# Patient Record
Sex: Female | Born: 1955 | Race: White | Hispanic: No | Marital: Married | Smoking: Former smoker
Health system: Southern US, Community
[De-identification: ages and names within clinical notes are randomized; demographics above are authoritative.]

## PROBLEM LIST (undated history)

## (undated) DIAGNOSIS — E7800 Pure hypercholesterolemia, unspecified: Secondary | ICD-10-CM

## (undated) DIAGNOSIS — K219 Gastro-esophageal reflux disease without esophagitis: Secondary | ICD-10-CM

## (undated) DIAGNOSIS — N23 Unspecified renal colic: Secondary | ICD-10-CM

## (undated) DIAGNOSIS — F411 Generalized anxiety disorder: Secondary | ICD-10-CM

## (undated) DIAGNOSIS — M1990 Unspecified osteoarthritis, unspecified site: Secondary | ICD-10-CM

## (undated) DIAGNOSIS — Z9079 Acquired absence of other genital organ(s): Secondary | ICD-10-CM

## (undated) DIAGNOSIS — Z9889 Other specified postprocedural states: Secondary | ICD-10-CM

## (undated) DIAGNOSIS — E039 Hypothyroidism, unspecified: Secondary | ICD-10-CM

## (undated) DIAGNOSIS — K5792 Diverticulitis of intestine, part unspecified, without perforation or abscess without bleeding: Secondary | ICD-10-CM

## (undated) DIAGNOSIS — A048 Other specified bacterial intestinal infections: Secondary | ICD-10-CM

## (undated) DIAGNOSIS — G4700 Insomnia, unspecified: Secondary | ICD-10-CM

## (undated) DIAGNOSIS — K449 Diaphragmatic hernia without obstruction or gangrene: Secondary | ICD-10-CM

## (undated) DIAGNOSIS — R5383 Other fatigue: Secondary | ICD-10-CM

## (undated) DIAGNOSIS — R160 Hepatomegaly, not elsewhere classified: Secondary | ICD-10-CM

## (undated) DIAGNOSIS — IMO0001 Reserved for inherently not codable concepts without codable children: Secondary | ICD-10-CM

## (undated) DIAGNOSIS — Z9189 Other specified personal risk factors, not elsewhere classified: Secondary | ICD-10-CM

## (undated) DIAGNOSIS — F3289 Other specified depressive episodes: Secondary | ICD-10-CM

## (undated) DIAGNOSIS — R109 Unspecified abdominal pain: Secondary | ICD-10-CM

## (undated) DIAGNOSIS — R5381 Other malaise: Secondary | ICD-10-CM

## (undated) DIAGNOSIS — K589 Irritable bowel syndrome without diarrhea: Secondary | ICD-10-CM

## (undated) DIAGNOSIS — R112 Nausea with vomiting, unspecified: Secondary | ICD-10-CM

## (undated) DIAGNOSIS — F17200 Nicotine dependence, unspecified, uncomplicated: Secondary | ICD-10-CM

## (undated) DIAGNOSIS — F329 Major depressive disorder, single episode, unspecified: Secondary | ICD-10-CM

## (undated) DIAGNOSIS — M797 Fibromyalgia: Secondary | ICD-10-CM

## (undated) DIAGNOSIS — N390 Urinary tract infection, site not specified: Secondary | ICD-10-CM

## (undated) HISTORY — DX: Nicotine dependence, unspecified, uncomplicated: F17.200

## (undated) HISTORY — DX: Diverticulitis of intestine, part unspecified, without perforation or abscess without bleeding: K57.92

## (undated) HISTORY — DX: Unspecified renal colic: N23

## (undated) HISTORY — DX: Insomnia, unspecified: G47.00

## (undated) HISTORY — DX: Other specified depressive episodes: F32.89

## (undated) HISTORY — PX: EYE SURGERY: SHX253

## (undated) HISTORY — DX: Major depressive disorder, single episode, unspecified: F32.9

## (undated) HISTORY — DX: Unspecified osteoarthritis, unspecified site: M19.90

## (undated) HISTORY — DX: Other specified bacterial intestinal infections: A04.8

## (undated) HISTORY — PX: LAPAROSCOPY: SHX197

## (undated) HISTORY — DX: Gastro-esophageal reflux disease without esophagitis: K21.9

## (undated) HISTORY — DX: Other fatigue: R53.83

## (undated) HISTORY — DX: Pure hypercholesterolemia, unspecified: E78.00

## (undated) HISTORY — DX: Diaphragmatic hernia without obstruction or gangrene: K44.9

## (undated) HISTORY — DX: Unspecified abdominal pain: R10.9

## (undated) HISTORY — PX: FOOT SURGERY: SHX648

## (undated) HISTORY — DX: Hepatomegaly, not elsewhere classified: R16.0

## (undated) HISTORY — PX: OTHER SURGICAL HISTORY: SHX169

## (undated) HISTORY — DX: Generalized anxiety disorder: F41.1

## (undated) HISTORY — DX: Acquired absence of other genital organ(s): Z90.79

## (undated) HISTORY — PX: ABDOMINAL HYSTERECTOMY: SHX81

## (undated) HISTORY — DX: Reserved for inherently not codable concepts without codable children: IMO0001

## (undated) HISTORY — DX: Other malaise: R53.81

## (undated) HISTORY — DX: Irritable bowel syndrome, unspecified: K58.9

## (undated) HISTORY — DX: Other specified personal risk factors, not elsewhere classified: Z91.89

## (undated) HISTORY — DX: Urinary tract infection, site not specified: N39.0

---

## 1998-08-06 HISTORY — PX: ABDOMINAL HYSTERECTOMY: SUR658

## 1998-08-06 HISTORY — PX: OOPHORECTOMY: SHX86

## 1998-11-07 ENCOUNTER — Emergency Department (HOSPITAL_COMMUNITY): Admission: EM | Admit: 1998-11-07 | Discharge: 1998-11-07 | Payer: Self-pay | Admitting: Emergency Medicine

## 1998-11-07 ENCOUNTER — Encounter: Payer: Self-pay | Admitting: Emergency Medicine

## 1999-02-23 ENCOUNTER — Other Ambulatory Visit: Admission: RE | Admit: 1999-02-23 | Discharge: 1999-02-23 | Payer: Self-pay | Admitting: Gynecology

## 1999-02-23 ENCOUNTER — Encounter (INDEPENDENT_AMBULATORY_CARE_PROVIDER_SITE_OTHER): Payer: Self-pay

## 1999-06-14 ENCOUNTER — Ambulatory Visit (HOSPITAL_COMMUNITY): Admission: RE | Admit: 1999-06-14 | Discharge: 1999-06-14 | Payer: Self-pay | Admitting: Orthopedic Surgery

## 1999-06-14 ENCOUNTER — Encounter: Payer: Self-pay | Admitting: Orthopedic Surgery

## 1999-10-11 ENCOUNTER — Inpatient Hospital Stay (HOSPITAL_COMMUNITY): Admission: RE | Admit: 1999-10-11 | Discharge: 1999-10-12 | Payer: Self-pay | Admitting: Gynecology

## 1999-10-11 ENCOUNTER — Encounter (INDEPENDENT_AMBULATORY_CARE_PROVIDER_SITE_OTHER): Payer: Self-pay | Admitting: Specialist

## 1999-12-26 ENCOUNTER — Ambulatory Visit (HOSPITAL_COMMUNITY): Admission: RE | Admit: 1999-12-26 | Discharge: 1999-12-26 | Payer: Self-pay | Admitting: Orthopedic Surgery

## 1999-12-26 ENCOUNTER — Encounter: Payer: Self-pay | Admitting: Orthopedic Surgery

## 2000-02-22 ENCOUNTER — Encounter: Admission: RE | Admit: 2000-02-22 | Discharge: 2000-03-26 | Payer: Self-pay | Admitting: Family Medicine

## 2000-10-24 ENCOUNTER — Ambulatory Visit (HOSPITAL_COMMUNITY): Admission: RE | Admit: 2000-10-24 | Discharge: 2000-10-24 | Payer: Self-pay | Admitting: Chiropractor

## 2000-10-24 ENCOUNTER — Encounter: Payer: Self-pay | Admitting: Chiropractor

## 2001-01-23 ENCOUNTER — Other Ambulatory Visit: Admission: RE | Admit: 2001-01-23 | Discharge: 2001-01-23 | Payer: Self-pay | Admitting: Family Medicine

## 2001-09-26 ENCOUNTER — Emergency Department (HOSPITAL_COMMUNITY): Admission: EM | Admit: 2001-09-26 | Discharge: 2001-09-26 | Payer: Self-pay | Admitting: *Deleted

## 2001-10-21 ENCOUNTER — Encounter: Admission: RE | Admit: 2001-10-21 | Discharge: 2001-10-21 | Payer: Self-pay | Admitting: Urology

## 2001-10-21 ENCOUNTER — Encounter: Payer: Self-pay | Admitting: Urology

## 2002-08-11 ENCOUNTER — Other Ambulatory Visit: Admission: RE | Admit: 2002-08-11 | Discharge: 2002-08-11 | Payer: Self-pay | Admitting: Family Medicine

## 2002-12-11 ENCOUNTER — Encounter: Payer: Self-pay | Admitting: Internal Medicine

## 2002-12-11 ENCOUNTER — Encounter: Admission: RE | Admit: 2002-12-11 | Discharge: 2002-12-11 | Payer: Self-pay | Admitting: Internal Medicine

## 2004-04-03 ENCOUNTER — Other Ambulatory Visit: Admission: RE | Admit: 2004-04-03 | Discharge: 2004-04-03 | Payer: Self-pay | Admitting: Family Medicine

## 2004-11-15 ENCOUNTER — Ambulatory Visit: Payer: Self-pay | Admitting: Family Medicine

## 2004-12-08 ENCOUNTER — Ambulatory Visit: Payer: Self-pay | Admitting: Family Medicine

## 2005-01-18 ENCOUNTER — Ambulatory Visit: Payer: Self-pay | Admitting: Family Medicine

## 2005-01-23 ENCOUNTER — Ambulatory Visit: Payer: Self-pay | Admitting: Family Medicine

## 2005-08-09 ENCOUNTER — Ambulatory Visit: Payer: Self-pay | Admitting: Family Medicine

## 2005-08-22 ENCOUNTER — Encounter: Admission: RE | Admit: 2005-08-22 | Discharge: 2005-08-22 | Payer: Self-pay | Admitting: Family Medicine

## 2005-11-12 ENCOUNTER — Ambulatory Visit: Payer: Self-pay | Admitting: Family Medicine

## 2005-11-12 ENCOUNTER — Encounter: Admission: RE | Admit: 2005-11-12 | Discharge: 2005-11-12 | Payer: Self-pay | Admitting: Family Medicine

## 2005-12-04 ENCOUNTER — Ambulatory Visit: Payer: Self-pay | Admitting: Family Medicine

## 2006-09-19 ENCOUNTER — Encounter: Admission: RE | Admit: 2006-09-19 | Discharge: 2006-09-19 | Payer: Self-pay | Admitting: Family Medicine

## 2006-12-18 DIAGNOSIS — IMO0001 Reserved for inherently not codable concepts without codable children: Secondary | ICD-10-CM

## 2006-12-18 DIAGNOSIS — F329 Major depressive disorder, single episode, unspecified: Secondary | ICD-10-CM

## 2006-12-18 DIAGNOSIS — F17200 Nicotine dependence, unspecified, uncomplicated: Secondary | ICD-10-CM

## 2006-12-18 DIAGNOSIS — M1990 Unspecified osteoarthritis, unspecified site: Secondary | ICD-10-CM | POA: Insufficient documentation

## 2006-12-18 DIAGNOSIS — Z9079 Acquired absence of other genital organ(s): Secondary | ICD-10-CM | POA: Insufficient documentation

## 2007-03-03 ENCOUNTER — Ambulatory Visit: Payer: Self-pay | Admitting: Family Medicine

## 2007-03-03 DIAGNOSIS — R319 Hematuria, unspecified: Secondary | ICD-10-CM | POA: Insufficient documentation

## 2007-03-03 LAB — CONVERTED CEMR LAB
Bilirubin Urine: NEGATIVE
Glucose, Urine, Semiquant: NEGATIVE
Protein, U semiquant: NEGATIVE
pH: 6.5

## 2007-03-04 ENCOUNTER — Encounter: Payer: Self-pay | Admitting: Family Medicine

## 2007-03-07 ENCOUNTER — Ambulatory Visit: Payer: Self-pay | Admitting: Family Medicine

## 2007-03-07 DIAGNOSIS — F411 Generalized anxiety disorder: Secondary | ICD-10-CM | POA: Insufficient documentation

## 2007-03-07 DIAGNOSIS — Z9189 Other specified personal risk factors, not elsewhere classified: Secondary | ICD-10-CM

## 2007-03-08 ENCOUNTER — Encounter: Payer: Self-pay | Admitting: Family Medicine

## 2007-03-17 ENCOUNTER — Telehealth: Payer: Self-pay | Admitting: Family Medicine

## 2007-03-21 ENCOUNTER — Ambulatory Visit: Payer: Self-pay | Admitting: Family Medicine

## 2007-03-21 DIAGNOSIS — N390 Urinary tract infection, site not specified: Secondary | ICD-10-CM | POA: Insufficient documentation

## 2007-03-21 LAB — CONVERTED CEMR LAB
Glucose, Urine, Semiquant: NEGATIVE
Specific Gravity, Urine: 1.02
WBC Urine, dipstick: NEGATIVE
pH: 6

## 2007-03-24 ENCOUNTER — Encounter (INDEPENDENT_AMBULATORY_CARE_PROVIDER_SITE_OTHER): Payer: Self-pay | Admitting: *Deleted

## 2007-03-24 LAB — CONVERTED CEMR LAB: IgG (Immunoglobin G), Serum: 925 mg/dL (ref 694–1618)

## 2007-03-25 ENCOUNTER — Telehealth: Payer: Self-pay | Admitting: Family Medicine

## 2007-03-25 ENCOUNTER — Ambulatory Visit: Payer: Self-pay | Admitting: Cardiology

## 2007-03-25 ENCOUNTER — Ambulatory Visit: Payer: Self-pay | Admitting: Family Medicine

## 2007-03-25 DIAGNOSIS — N23 Unspecified renal colic: Secondary | ICD-10-CM | POA: Insufficient documentation

## 2007-03-26 ENCOUNTER — Telehealth (INDEPENDENT_AMBULATORY_CARE_PROVIDER_SITE_OTHER): Payer: Self-pay | Admitting: *Deleted

## 2007-04-01 ENCOUNTER — Ambulatory Visit (HOSPITAL_COMMUNITY): Admission: RE | Admit: 2007-04-01 | Discharge: 2007-04-01 | Payer: Self-pay | Admitting: Urology

## 2007-09-22 ENCOUNTER — Encounter: Admission: RE | Admit: 2007-09-22 | Discharge: 2007-09-22 | Payer: Self-pay | Admitting: Family Medicine

## 2007-12-09 ENCOUNTER — Telehealth (INDEPENDENT_AMBULATORY_CARE_PROVIDER_SITE_OTHER): Payer: Self-pay | Admitting: *Deleted

## 2009-08-17 ENCOUNTER — Encounter: Admission: RE | Admit: 2009-08-17 | Discharge: 2009-08-17 | Payer: Self-pay | Admitting: Family Medicine

## 2010-04-18 ENCOUNTER — Encounter: Payer: Self-pay | Admitting: Gastroenterology

## 2010-04-24 ENCOUNTER — Encounter (INDEPENDENT_AMBULATORY_CARE_PROVIDER_SITE_OTHER): Payer: Self-pay | Admitting: *Deleted

## 2010-06-07 DIAGNOSIS — G4700 Insomnia, unspecified: Secondary | ICD-10-CM

## 2010-06-07 DIAGNOSIS — E7800 Pure hypercholesterolemia, unspecified: Secondary | ICD-10-CM

## 2010-06-07 DIAGNOSIS — R5383 Other fatigue: Secondary | ICD-10-CM

## 2010-06-07 DIAGNOSIS — R109 Unspecified abdominal pain: Secondary | ICD-10-CM | POA: Insufficient documentation

## 2010-06-07 DIAGNOSIS — R5381 Other malaise: Secondary | ICD-10-CM

## 2010-06-09 ENCOUNTER — Ambulatory Visit: Payer: Self-pay | Admitting: Gastroenterology

## 2010-06-09 ENCOUNTER — Encounter (INDEPENDENT_AMBULATORY_CARE_PROVIDER_SITE_OTHER): Payer: Self-pay | Admitting: *Deleted

## 2010-06-09 DIAGNOSIS — K589 Irritable bowel syndrome without diarrhea: Secondary | ICD-10-CM

## 2010-06-09 LAB — CONVERTED CEMR LAB
Basophils Relative: 1.2 % (ref 0.0–3.0)
Eosinophils Relative: 1.7 % (ref 0.0–5.0)
HCT: 37.7 % (ref 36.0–46.0)
Hemoglobin: 13 g/dL (ref 12.0–15.0)
Lymphs Abs: 2.8 10*3/uL (ref 0.7–4.0)
Monocytes Relative: 6 % (ref 3.0–12.0)
Neutro Abs: 3.1 10*3/uL (ref 1.4–7.7)
RBC: 3.8 M/uL — ABNORMAL LOW (ref 3.87–5.11)
Saturation Ratios: 33.5 % (ref 20.0–50.0)
Tissue Transglutaminase Ab, IgA: 12.5 units (ref <20)
WBC: 6.4 10*3/uL (ref 4.5–10.5)

## 2010-06-20 ENCOUNTER — Ambulatory Visit (HOSPITAL_COMMUNITY): Admission: RE | Admit: 2010-06-20 | Discharge: 2010-06-20 | Payer: Self-pay | Admitting: Gastroenterology

## 2010-07-19 ENCOUNTER — Ambulatory Visit: Payer: Self-pay | Admitting: Gastroenterology

## 2010-07-19 DIAGNOSIS — K449 Diaphragmatic hernia without obstruction or gangrene: Secondary | ICD-10-CM

## 2010-07-19 DIAGNOSIS — K5792 Diverticulitis of intestine, part unspecified, without perforation or abscess without bleeding: Secondary | ICD-10-CM

## 2010-07-19 HISTORY — DX: Diverticulitis of intestine, part unspecified, without perforation or abscess without bleeding: K57.92

## 2010-07-19 HISTORY — DX: Diaphragmatic hernia without obstruction or gangrene: K44.9

## 2010-07-25 ENCOUNTER — Encounter: Payer: Self-pay | Admitting: Gastroenterology

## 2010-08-11 ENCOUNTER — Ambulatory Visit
Admission: RE | Admit: 2010-08-11 | Discharge: 2010-08-11 | Payer: Self-pay | Source: Home / Self Care | Attending: Gastroenterology | Admitting: Gastroenterology

## 2010-09-03 LAB — CONVERTED CEMR LAB
Bilirubin Urine: NEGATIVE
Glucose, Urine, Semiquant: NEGATIVE
Ketones, urine, test strip: NEGATIVE
Protein, U semiquant: NEGATIVE
pH: 6

## 2010-09-05 ENCOUNTER — Encounter
Admission: RE | Admit: 2010-09-05 | Discharge: 2010-09-05 | Payer: Self-pay | Source: Home / Self Care | Attending: Family Medicine | Admitting: Family Medicine

## 2010-09-07 NOTE — Procedures (Signed)
Summary: Colonoscopy  Patient: [name] Note: All result statuses are Final unless otherwise noted.  Tests: (1) Colonoscopy (COL)   COL Colonoscopy           DONE     [facility]     [address]     [place], [place]  [address]           COLONOSCOPY PROCEDURE REPORT           PATIENT:  Rachael Sutton, Rachael Sutton  MR#:  [id number]     BIRTHDATE:  [birth year], 54 yrs. old  GENDER:  female     ENDOSCOPIST:  Vania Rea. Jarold Motto, MD, [facility]     REF. BY:  Charlesetta Shanks, M.D.     PROCEDURE DATE:  07/19/2010     PROCEDURE:  Average-risk screening colonoscopy     G0121     ASA CLASS:  Class II     INDICATIONS:  Routine Risk Screening HX. OF IBS.     MEDICATIONS:   Fentanyl 75 mcg IV, Versed 7 IV           DESCRIPTION OF PROCEDURE:   After the risks benefits and     alternatives of the procedure were thoroughly explained, informed     consent was obtained.  Digital rectal exam was performed and     revealed no abnormalities.   The LB 180AL [id number] endoscope was     introduced through the anus and advanced to the cecum, which was     identified by both the appendix and ileocecal valve, without     limitations.  The quality of the prep was excellent, using     MoviPrep.  The instrument was then slowly withdrawn as the colon     was fully examined.     <<PROCEDUREIMAGES>>           FINDINGS:  No polyps or cancers were seen.  Moderate     diverticulosis was found in the sigmoid to descending colon     segments.   Retroflexed views in the rectum revealed no     abnormalities.    The scope was then withdrawn from the patient     and the procedure completed.           COMPLICATIONS:  None     ENDOSCOPIC IMPRESSION:     1) No polyps or cancers     2) Moderate diverticulosis in the sigmoid to descending colon     segments     CHRONIC IBS.     RECOMMENDATIONS:     1) Continue current medications     2) Continue current colorectal screening recommendations for     "routine risk" patients  with a repeat colonoscopy in 10 years.     REPEAT EXAM:  No           ______________________________     Vania Rea. Jarold Motto, MD, [facility]           CC:           n.     eSIGNED:   Vania Rea. Patterson at 07/19/2010 11:23 AM           Osborne Casco, [id number]  Note: An exclamation mark (!) indicates a result that was not dispersed into the flowsheet. Document Creation Date: 07/19/2010 11:23 AM _______________________________________________________________________  (1) Order result status: Final Collection or observation date-time: 07/19/2010 11:17 Requested date-time:  Receipt date-time:  Reported date-time:  Referring Physician:   Ordering Physician:  Sheryn Bison [phone]) Specimen Source:  Source: Launa Grill Order Number: [id number] Lab site:   Appended Document: Colonoscopy    Clinical Lists Changes  Observations: Added new observation of COLONNXTDUE: 07/2020 (07/19/2010 12:10)

## 2010-09-07 NOTE — Medication Information (Signed)
Summary: PPI certified in total/[employer]  PPI certified in total/[employer]   Imported By: Sherian Rein 08/01/2010 14:09:38  _____________________________________________________________________  External Attachment:    Type:   Image     Comment:   External Document

## 2010-09-07 NOTE — Letter (Signed)
Summary: New Patient letter  [facility]  [address] [place], [place] [address]   Phone: [phone]  Fax: [phone]       04/24/2010 MRN: [id number]  Rachael Sutton [address] [place], [place]  [address]  Dear Ms. Rachael Sutton,  Welcome to the Gastroenterology Division at [facility].    You are scheduled to see Dr. Jarold Motto on 06/09/2010 at 8:30AM on the 3rd floor at [facility], [address]  We ask that you try to arrive at our office 15 minutes prior to your appointment time to allow for check-in.  We would like you to complete the enclosed self-administered evaluation form prior to your visit and bring it with you on the day of your appointment.  We will review it with you.  Also, please bring a complete list of all your medications or, if you prefer, bring the medication bottles and we will list them.  Please bring your insurance card so that we may make a copy of it.  If your insurance requires a referral to see a specialist, please bring your referral form from your primary care physician.  Co-payments are due at the time of your visit and may be paid by cash, check or credit card.     Your office visit will consist of a consult with your physician (includes a physical exam), any laboratory testing he/she may order, scheduling of any necessary diagnostic testing (e.g. x-ray, ultrasound, CT-scan), and scheduling of a procedure (e.g. Endoscopy, Colonoscopy) if required.  Please allow enough time on your schedule to allow for any/all of these possibilities.    If you cannot keep your appointment, please call [phone] to cancel or reschedule prior to your appointment date.  This allows [place] the opportunity to schedule an appointment for another patient in need of care.  If you do not cancel or reschedule by 5 p.m. the business day prior to your appointment date, you will be charged a $50.00 late cancellation/no-show fee.    Thank you for  choosing [facility] for your medical needs.  We appreciate the opportunity to care for you.  Please visit [place] at our website  to learn more about our practice.                     Sincerely,                                                             The Gastroenterology Division

## 2010-09-07 NOTE — Miscellaneous (Signed)
Summary: Orders Update-Clotest  Clinical Lists Changes  Problems: Added new problem of HIATAL HERNIA (ICD-553.3) Orders: Added new Test order of TLB-H Pylori Screen Gastric Biopsy (83013-CLOTEST) - Signed 

## 2010-09-07 NOTE — Assessment & Plan Note (Signed)
Summary: follow up ECL/lk   History of Present Illness Visit Type: Follow-up Visit Primary GI MD: Sheryn Bison MD FACP FAGA Primary Provider: [name], MD Requesting Provider: na Chief Complaint: F/u from endo/colon and [place]. Pt c/o severe GERD, sore throat, epigastric pain, and loss of appetite  History of Present Illness:   Rachael Sutton continues with epigastric discomfort unrelieved by Dexilant 60 mg a day. She also continues with acid reflux symptoms. Her endoscopy and colonoscopy were unremarkable except for erosive esophagitis. CLO biopsy was positive for H. pylori. Upper abdominal ultrasound exam was normal and did not confirm hepatomegaly, and other labs were unremarkable. She does use p.r.n. Levsin for spasms. She also is on Benefiber and takes daily Zoloft.  She has a history of severe penicillin allergy with severe rash and mucosal inflammation. She uses ethanol socially.   GI Review of Systems    Reports abdominal pain, acid reflux, heartburn, and  loss of appetite.     Location of  Abdominal pain: epigastric area.    Denies belching, bloating, chest pain, dysphagia with liquids, dysphagia with solids, nausea, vomiting, vomiting blood, weight loss, and  weight gain.        Denies anal fissure, black tarry stools, change in bowel habit, constipation, diarrhea, diverticulosis, fecal incontinence, heme positive stool, hemorrhoids, irritable bowel syndrome, jaundice, light color stool, liver problems, rectal bleeding, and  rectal pain.    Current Medications (verified): 1)  Femring 0.05 Mg/24hr Ring (Estradiol Acetate) .... Vaginal Every 3 Months 2)  Lorazepam 0.5 Mg Tabs (Lorazepam) .... Take One By Mouth As Needed 3)  Sertraline Hcl 50 Mg Tabs (Sertraline Hcl) .... Take One By Mouth Once Daily 4)  Nasonex 50 Mcg/act Susp (Mometasone Furoate) .... Use Per Nostrils Three Times A Week 5)  Bupropion Hcl 75 Mg Tabs (Bupropion Hcl) .... Take One By Mouth Once Daily 6)  Levsin 0.125  Mg  Tabs (Hyoscyamine Sulfate) .... Sl Every 6 Hrs As Needed 7)  Dexilant 60 Mg Cpdr (Dexlansoprazole) .... 60 Mg Take One Every Morning 30 Minutes Before Meals #30x11 8)  Benefiber  Powd (Wheat Dextrin) .... As Directed  Allergies (verified): 1)  ! Pcn  Past History:  Past medical, surgical, family and social histories (including risk factors) reviewed for relevance to current acute and chronic problems.  Past Medical History: HIATAL HERNIA (ICD-553.3) HEPATOMEGALY (ICD-789.1) GERD (ICD-530.81) IBS (ICD-564.1) INSOMNIA (ICD-780.52) ABDOMINAL PAIN (ICD-789.00) HYPERCHOLESTEROLEMIA (ICD-272.0) FATIGUE (ICD-780.79) SYMPTOM, COLIC, RENAL (OZD-664.4) INFECTION, URINARY TRACT NOS (ICD-599.0) CHEST PAIN, ATYPICAL, HX OF (ICD-V15.89) ANXIETY STATE NOS (ICD-300.00) HEMATURIA (ICD-599.7) TAH/BSO, HX OF (ICD-V45.77) SMOKER (ICD-305.1) DEPRESSION (ICD-311) DEGENERATIVE JOINT DISEASE (ICD-715.90) FIBROMYALGIA (ICD-729.1)  Past Surgical History: Reviewed history from 06/07/2010 and no changes required. oophorectomy 2000 Hysterectomy 2000 Laprascopy 1979 & 1985  Family History: Reviewed history from 06/09/2010 and no changes required. Family History of Diabetes: Father  Family History of Heart Disease: Father  No FH of Colon Cancer: Ovarian  Cancer: Mom Family History of Stomach Cancer: Maternal Aunt   Social History: Reviewed history from 06/09/2010 and no changes required. Patient gets regular exercise. 3-4 times a week plays Tennis Patient is a former smoker.  Alcohol Use - yes occasional  Daily Caffeine Use 1-2 pepsi a day  Illicit Drug Use - no Occupation: retired married no childern   Review of Systems       The patient complains of allergy/sinus, sore throat, and voice change.  The patient denies anemia, anxiety-new, arthritis/joint pain, back pain, blood in urine,  breast changes/lumps, change in vision, confusion, cough, coughing up blood, depression-new, fainting,  fatigue, fever, headaches-new, hearing problems, heart murmur, heart rhythm changes, itching, menstrual pain, muscle pains/cramps, night sweats, nosebleeds, pregnancy symptoms, shortness of breath, skin rash, sleeping problems, swelling of feet/legs, swollen lymph glands, thirst - excessive , urination - excessive , urination changes/pain, urine leakage, and vision changes.    Vital Signs:  Patient profile:   55 year old female Height:      68 inches Weight:      165 pounds BMI:     25.18 BSA:     1.89 Pulse rate:   76 / minute Pulse rhythm:   regular BP sitting:   118 / 64  (left arm) Cuff size:   regular  Vitals Entered By: Ok Anis CMA (August 11, 2010 10:13 AM)  Physical Exam  General:  Well developed, well nourished, no acute distress.healthy appearing.   Head:  Normocephalic and atraumatic. Eyes:  PERRLA, no icterus.exam deferred to patient's ophthalmologist.   Abdomen:  Soft, nontender and nondistended. No masses, hepatosplenomegaly or hernias noted. Normal bowel sounds.Minimal tenderness midepigastric area, but otherwise normal exam. No significant distention or organomegaly noted. Psych:  Alert and cooperative. Normal mood and affect.   Impression & Recommendations:  Problem # 1:  ABDOMINAL PAIN (ICD-789.00) Assessment Unchanged She has had no improvement with standard antireflux maneuvers and PPI therapy. She is H. pylori positive, and I will treat her with metronidazole, clarithromycin, and omeprazole for 10 days with avoidance of alcohol products. She is return in 2 weeks' time for followup. If she is still symptomatic we will proceed with 24-hour pH probe testing and esophageal manometry.  Problem # 2:  IBS (ICD-564.1) Assessment: Unchanged continue fiber supplements and p.r.n. Levsin use.  Problem # 3:  ANXIETY STATE NOS (ICD-300.00) Assessment: Improved  Patient Instructions: 1)  Copy sent to : [name], MD 2)  Your prescription(s) have been sent to  you pharmacy.  3)  Helicobacter Pylori brochure given.  4)  Please schedule a follow-up appointment in 2 weeks.  5)  Aviod alcohol while taking the antibiotics. 6)  Stop you Dexilant while on the antibiotics and take the Omperazole.  7)  The medication list was reviewed and reconciled.  All changed / newly prescribed medications were explained.  A complete medication list was provided to the patient / caregiver. Prescriptions: CLARITHROMYCIN 500 MG TABS (CLARITHROMYCIN) take one by mouth two times a day for 10 days  #20 x 0   Entered by:   Harlow Mares CMA (AAMA)   Authorized by:   Mardella Layman MD [facility]   Signed by:   Harlow Mares CMA (AAMA) on 08/11/2010   Method used:   Electronically to        [facility] [address] #[id number]* (retail)       [address]       [place], [place]  [address]       Ph: [phone]       Fax: [phone]   RxID:   [id number] METRONIDAZOLE 250 MG TABS (METRONIDAZOLE) take one by mouth four times a day for 10 days  #40 x 0   Entered by:   Harlow Mares CMA (AAMA)   Authorized by:   Mardella Layman MD [facility]   Signed by:   Harlow Mares CMA (AAMA) on 08/11/2010   Method used:   Electronically to  [facility] [address] #[id number]* (retail)       [address]       [place], [place]  [address]       Ph: [phone]       Fax: [phone]   RxID:   [id number]

## 2010-09-07 NOTE — Letter (Signed)
Summary: [facility] Instructions  [facility]  [address] [place], [place] [address]   Phone: [phone]  Fax: [phone]       Rachael Sutton    February 08, 1956    MRN: [id number]        Procedure Day /Date: 07/19/2010 Wednesday     Arrival Time: 9:00am     Procedure Time: 10:00am     Location of Procedure:                    X  [facility] (4th Floor)   PREPARATION FOR COLONOSCOPY WITH MOVIPREP   Starting 5 days prior to your procedure 07/14/2010 do not eat nuts, seeds, popcorn, corn, beans, peas,  salads, or any raw vegetables.  Do not take any fiber supplements (e.g. Metamucil, Citrucel, and Benefiber).  THE DAY BEFORE YOUR PROCEDURE         DATE: 07/18/2010  DAY: Tuesday  1.  Drink clear liquids the entire day-NO SOLID FOOD  2.  Do not drink anything colored red or purple.  Avoid juices with pulp.  No orange juice.  3.  Drink at least 64 oz. (8 glasses) of fluid/clear liquids during the day to prevent dehydration and help the prep work efficiently.  CLEAR LIQUIDS INCLUDE: Water Jello Ice Popsicles Tea (sugar ok, no milk/cream) Powdered fruit flavored drinks Coffee (sugar ok, no milk/cream) Gatorade Juice: apple, white grape, white cranberry  Lemonade Clear bullion, consomm, broth Carbonated beverages (any kind) Strained chicken noodle soup Hard Candy                             4.  In the morning, mix first dose of MoviPrep solution:    Empty 1 Pouch A and 1 Pouch B into the disposable container    Add lukewarm drinking water to the top line of the container. Mix to dissolve    Refrigerate (mixed solution should be used within 24 hrs)  5.  Begin drinking the prep at 5:00 p.m. The MoviPrep container is divided by 4 marks.   Every 15 minutes drink the solution down to the next mark (approximately 8 oz) until the full liter is complete.   6.  Follow completed prep with 16 oz of clear liquid of your choice (Nothing red or  purple).  Continue to drink clear liquids until bedtime.  7.  Before going to bed, mix second dose of MoviPrep solution:    Empty 1 Pouch A and 1 Pouch B into the disposable container    Add lukewarm drinking water to the top line of the container. Mix to dissolve    Refrigerate  THE DAY OF YOUR PROCEDURE      DATE: 07/18/2010  DAY: Wednesday  Beginning at 5:00am (5 hours before procedure):         1. Every 15 minutes, drink the solution down to the next mark (approx 8 oz) until the full liter is complete.  2. Follow completed prep with 16 oz. of clear liquid of your choice.    3. You may drink clear liquids until 8:00am (2 HOURS BEFORE PROCEDURE).   MEDICATION INSTRUCTIONS  Unless otherwise instructed, you should take regular prescription medications with a small sip of water   as early as possible the morning of your procedure.         OTHER INSTRUCTIONS  You will need a responsible adult at least 54 years of age  to accompany you and drive you home.   This person must remain in the waiting room during your procedure.  Wear loose fitting clothing that is easily removed.  Leave jewelry and other valuables at home.  However, you may wish to bring a book to read or  an iPod/MP3 player to listen to music as you wait for your procedure to start.  Remove all body piercing jewelry and leave at home.  Total time from sign-in until discharge is approximately 2-3 hours.  You should go home directly after your procedure and rest.  You can resume normal activities the  day after your procedure.  The day of your procedure you should not:   Drive   Make legal decisions   Operate machinery   Drink alcohol   Return to work  You will receive specific instructions about eating, activities and medications before you leave.    The above instructions have been reviewed and explained to me by   _______________________    I fully understand and can verbalize these  instructions _____________________________ Date _________

## 2010-09-07 NOTE — Procedures (Signed)
Summary: Upper Endoscopy  Patient: Rachael Sutton Note: All result statuses are Final unless otherwise noted.  Tests: (1) Upper Endoscopy (EGD)   EGD Upper Endoscopy       DONE     [facility]     [address]     [place], [place]  [address]           ENDOSCOPY PROCEDURE REPORT           PATIENT:  Rachael Sutton, Rachael Sutton  MR#:  [id number]     BIRTHDATE:  05-03-1956, 54 yrs. old  GENDER:  female           ENDOSCOPIST:  Vania Rea. Jarold Motto, MD, [facility]     Referred by:  Charlesetta Shanks, M.D.           PROCEDURE DATE:  07/19/2010     PROCEDURE:  EGD with biopsy, [address]     ASA CLASS:  Class II     INDICATIONS:  GERD           MEDICATIONS:   There was residual sedation effect present from     prior procedure., Versed 1 mg IV     TOPICAL ANESTHETIC:           DESCRIPTION OF PROCEDURE:   After the risks benefits and     alternatives of the procedure were thoroughly explained, informed     consent was obtained.  The LB [id number] [id number] endoscope was     introduced through the mouth and advanced to the second portion of     the duodenum, without limitations.  The instrument was slowly     withdrawn as the mucosa was fully examined.     <<PROCEDUREIMAGES>>           A hiatal hernia was found. 3-4 CM. HH NOTED.  Esophagitis was     found in the distal esophagus. HEALING LINEAR EROSION NOTED.SEE     PICTURES.  Normal duodenal folds were noted.  The stomach was     entered and closely examined. The antrum, angularis, and lesser     curvature were well visualized, including a retroflexed view of     the cardia and fundus. The stomach wall was normally distensable.     The scope passed easily through the pylorus into the duodenum. CLO     BX. DONE.    Retroflexed views revealed a hiatal hernia.    The     scope was then withdrawn from the patient and the procedure     completed.           COMPLICATIONS:  None           ENDOSCOPIC IMPRESSION:     1) Esophagitis in the distal esophagus    2) Normal duodenal folds     3) Normal stomach     4) A hiatal hernia     CHRONIC EROSIVE GERD.NO NSAID DAMAGE NOTED.     RECOMMENDATIONS:     1) Anti-reflux regimen to be follow     DEXILANT 60 MG/QAM           REPEAT EXAM:  No           ______________________________     Vania Rea. Jarold Motto, MD, [facility]           CC:           n.     eSIGNED:   Vania Rea. Patterson at 07/19/2010 11:34 AM  [place], [place], [id number]  Note: An exclamation mark (!) indicates a result that was not dispersed into the flowsheet. Document Creation Date: 07/19/2010 11:34 AM _______________________________________________________________________  (1) Order result status: Final Collection or observation date-time: 07/19/2010 11:27 Requested date-time:  Receipt date-time:  Reported date-time:  Referring Physician:   Ordering Physician: Sheryn Bison [phone]) Specimen Source:  Source: Launa Grill Order Number: [id number] Lab site:

## 2010-09-07 NOTE — Miscellaneous (Signed)
Summary: Rx Dexilant and Levsin  Clinical Lists Changes  Medications: Added new medication of LEVSIN 0.125 MG  TABS (HYOSCYAMINE SULFATE) SL every 6 hrs as needed - Signed Added new medication of DEXILANT 60 MG CPDR (DEXLANSOPRAZOLE) 60 mg take one every morning 30 minutes before meals #30X11 - Signed Rx of LEVSIN 0.125 MG  TABS (HYOSCYAMINE SULFATE) SL every 6 hrs as needed;  #60 x 6;  Signed;  Entered by: Doristine Church RN II;  Authorized by: Mardella Layman MD [facility];  Method used: Electronically to [facility] [address] [phone]*, [address], [place], [place], [place]  [address], Ph: [phone], Fax: [phone] Rx of DEXILANT 60 MG CPDR (DEXLANSOPRAZOLE) 60 mg take one every morning 30 minutes before meals #30X11;  #30 x 11;  Signed;  Entered by: Doristine Church RN II;  Authorized by: Mardella Layman MD [facility];  Method used: Electronically to [facility] [address] #[facility]2952*, [address] [place], [place], [place]  [address], Ph: [phone], Fax: [phone] Observations: Added new observation of ALLERGY REV: Done (07/19/2010 11:48)    Prescriptions: DEXILANT 60 MG CPDR (DEXLANSOPRAZOLE) 60 mg take one every morning 30 minutes before meals #30X11  #30 x 11   Entered by:   Doristine Church RN II   Authorized by:   Mardella Layman MD [facility]   Signed by:   Doristine Church RN II on 07/19/2010   Method used:   Electronically to        [facility] [address] [phone]* (retail)       [address]       [place], [place]  [address]       Ph: [phone]       Fax: [phone]   RxID:   [id number] LEVSIN 0.125 MG  TABS (HYOSCYAMINE SULFATE) SL every 6 hrs as needed  #60 x 6   Entered by:   Doristine Church RN II   Authorized by:   Mardella Layman MD [facility]   Signed by:   Doristine Church RN II on 07/19/2010   Method used:   Electronically to        [facility] [address] #[address]23* (retail)       [address]       [place], [place]  [address]       Ph: [phone]       Fax: [phone]   RxID:   [id number]

## 2010-09-07 NOTE — Assessment & Plan Note (Signed)
Summary: abd pain...as.   History of Present Illness Visit Type: consult  Primary GI MD: Sheryn Bison MD FACP FAGA Primary Provider: [name], MD Requesting Provider: [name], MD  Chief Complaint: Epigastric pain, GERD, diarrhea, and bloating  History of Present Illness:   very pleasant 55 year old Caucasian female referred through the courtesy of Dr. Celene Skeen for evaluation of six-month history of abdominal cramping, gas, bloating, and alternating diarrhea and constipation without rectal bleeding.  Patient has rather marked indigestion and acid reflux not relieved by ranitidine 75 mg p.r.n. She denies associated dysphagia, anorexia or weight loss. 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 She does have dyspepsia and epigastric pain, but denies been documented history of peptic ulcer disease. She has no specific food intolerances, gas, bloating, melena or hematochezia. There is no history of previous barium studies or endoscopic exams. There is no history of systemic complaints with associated arthritis elsewhere, skin rashes, mouth sores, fever chills. She is status post hysterectomy and removal of her ovaries. Family history is noncontributory in terms of gastrointestinal problems.   GI Review of Systems    Reports abdominal pain, acid reflux, belching, bloating, heartburn, and  nausea.     Location of  Abdominal pain: epigastric area.    Denies chest pain, dysphagia with liquids, dysphagia with solids, loss of appetite, vomiting, vomiting blood, weight loss, and  weight gain.      Reports diarrhea and  irritable bowel syndrome.     Denies anal fissure, black tarry stools, change in bowel habit, constipation, diverticulosis, fecal incontinence, heme positive stool, hemorrhoids, jaundice, light color stool, liver problems, rectal bleeding, and  rectal pain.    Current Medications  (verified): 1)  Femring 0.05 Mg/24hr Ring (Estradiol Acetate) .... Vaginal Every 3 Months 2)  Lorazepam 0.5 Mg Tabs (Lorazepam) .... Take One By Mouth As Needed 3)  Sertraline Hcl 50 Mg Tabs (Sertraline Hcl) .... Take One By Mouth Once Daily 4)  Nasonex 50 Mcg/act Susp (Mometasone Furoate) .... Use Per Nostrils Three Times A Week 5)  Bupropion Hcl 75 Mg Tabs (Bupropion Hcl) .... Take One By Mouth Once Daily 6)  Ranitidine Acid Reducer 75 Mg Tabs (Ranitidine Hcl) .... As Needed  Allergies (verified): 1)  ! Pcn  Past History:  Past medical, surgical, family and social histories (including risk factors) reviewed for relevance to current acute and chronic problems.  Past Medical History: GERD (ICD-530.81) IBS (ICD-564.1) INSOMNIA (ICD-780.52) ABDOMINAL PAIN (ICD-789.00) HYPERCHOLESTEROLEMIA (ICD-272.0) FATIGUE (ICD-780.79) SYMPTOM, COLIC, RENAL (ZOX-096.0) INFECTION, URINARY TRACT NOS (ICD-599.0) CHEST PAIN, ATYPICAL, HX OF (ICD-V15.89) ANXIETY STATE NOS (ICD-300.00) HEMATURIA (ICD-599.7) TAH/BSO, HX OF (ICD-V45.77) SMOKER (ICD-305.1) DEPRESSION (ICD-311) DEGENERATIVE JOINT DISEASE (ICD-715.90) FIBROMYALGIA (ICD-729.1)  Past Surgical History: Reviewed history from 06/07/2010 and no changes required. oophorectomy 2000 Hysterectomy 2000 Laprascopy 1979 & 1985  Family History: Reviewed history from 06/07/2010 and no changes required. Family History of Diabetes: Father  Family History of Heart Disease: Father  No FH of Colon Cancer: Ovarian  Cancer: Mom Family History of Stomach Cancer: Maternal Aunt   Social History: Reviewed history from 06/07/2010 and no changes required. Patient gets regular exercise. 3-4 times a week plays Tennis Patient is a former smoker.  Alcohol Use - yes occasional  Daily Caffeine Use 1-2 pepsi a day  Illicit Drug Use - no Occupation: retired married no childern   Review of Systems  The patient complains of back pain.  The patient  denies anemia, blood in urine, breast changes/lumps, change in vision, confusion, cough, coughing up blood, fainting, fever, headaches-new, hearing problems, heart murmur, heart rhythm changes, itching, menstrual pain, night sweats, nosebleeds, pregnancy symptoms, shortness of breath, skin rash, sore throat, swelling of feet/legs, swollen lymph glands, thirst - excessive , urination - excessive , urination changes/pain, urine leakage, vision changes, voice change, allergy/sinus, anxiety-new, arthritis/joint pain, depression-new, fatigue, muscle pains/cramps, and sleeping problems.         She quit smoking earlier this year with Chantix supplementation. She denies any cardiovascular or pulmonary complaints. She does take Sertraline 50 mg a day and p.r.n. lorazepam. She has not been on oral estrogens but does have am Flemring in place.   Vital Signs:  Patient profile:   55 year old female Height:      68 inches Weight:      163 pounds BMI:     24.87 BSA:     1.88 Pulse rate:   76 / minute Pulse rhythm:   regular BP sitting:   120 / 64  (left arm) Cuff size:   regular  Vitals Entered By: Ok Anis CMA (June 09, 2010 9:01 AM)  Physical Exam  General:  Well developed, well nourished, no acute distress.healthy appearing.   Head:  Normocephalic and atraumatic. Eyes:  PERRLA, no icterus.exam deferred to patient's ophthalmologist.   Neck:  Supple; no masses or thyromegaly. Lungs:  Clear throughout to auscultation. Heart:  Regular rate and rhythm; no murmurs, rubs,  or bruits. Abdomen:  Soft, nontender and nondistended. No masses, hepatosplenomegaly or hernias noted. Normal bowel sounds.Mild hepatomegaly with some tenderness over the left lobe of the liver. There is no evidence of ascites, other masses or tenderness. Rectal:  deferred until time of colonoscopy.   Msk:  Symmetrical with no gross deformities. Normal posture.Decreased Range Of motion in her right hip with some pain with internal  rotation. Pulses:  Normal pulses noted. Extremities:  No clubbing, cyanosis, edema or deformities noted. Neurologic:  Alert and  oriented x4;  grossly normal neurologically. Cervical Nodes:  No significant cervical adenopathy. Psych:  Alert and cooperative. Normal mood and affect.   Impression & Recommendations:  Problem # 1:  GERD (ICD-530.81) Assessment Deteriorated Anti-Reflux Maneuvers reviewed with the patient and will start Dexilant 60 mg for the evening meals because most of her symptoms are nocturnal in nature. Endoscopy an upper abdominal ultrasound exam has been scheduled. Orders: TLB-B12, Serum-Total ONLY (16109-U04) TLB-Ferritin (82728-FER) TLB-Folic Acid (Folate) (82746-FOL) TLB-IBC Pnl (Iron/FE;Transferrin) (83550-IBC) TLB-CBC Platelet - w/Differential (85025-CBCD) Colon/Endo (Colon/Endo) Ultrasound Abdomen (UAS)  Problem # 2:  HEPATOMEGALY (ICD-789.1) Assessment: Unchanged lab review shows normal previous liver function tests. Ultrasound ordered to exclude hepatic adenoma or other structural lesions. The patient denies a history of hepatitis or significant ethanol abuse. Family history in terms of liver disease is noncontributory.  Problem # 3:  IBS (ICD-564.1) Assessment: Deteriorated Classic IBS with alternating diarrhea and constipation. Colonoscopy has been ordered for exam for her age. We'll consider trial of probiotic therapy. She denies any specific food intolerances or abuse of sorbitol or fructose. She also denies lactose intolerance.We Will Check celiac serologies. Orders: TLB-B12, Serum-Total ONLY (54098-J19) TLB-Ferritin (82728-FER) TLB-Folic Acid (Folate) (82746-FOL) TLB-IBC Pnl (Iron/FE;Transferrin) (83550-IBC) TLB-CBC Platelet - w/Differential (85025-CBCD) Colon/Endo (Colon/Endo) Ultrasound Abdomen (UAS)  Problem # 4:  CHEST PAIN, ATYPICAL, HX OF (ICD-V15.89) Assessment: Unchanged Probably acid reflux related---rule out cholelithiasis. Denied any  history of  cardiovascular symptomatology but the patient is a long-term smoker and may need cardiac stress testing.  Patient Instructions: 1)  Copy sent to : [name], MD 2)  Please go to the basement today for your labs.  3)  Your prescription(s) have been sent to you pharmacy.  4)  Your procedure has been scheduled for 07/19/2010, please follow the seperate instructions.  5)  Your abdominal ultrasound is scheduled for 06/20/2010, please follow the seperate instructions.  6)  [facility] Patient Information Guide given to patient.  7)  Avoid foods high in acid content ( tomatoes, citrus juices, spicy foods) . Avoid eating within 3 to 4 hours of lying down or before exercising. Do not over eat; try smaller more frequent meals. Elevate head of bed four inches when sleeping.  8)  Colonoscopy and Flexible Sigmoidoscopy brochure given.  9)  GI Reflux brochure given.  10)  Upper Endoscopy brochure given.  11)  The medication list was reviewed and reconciled.  All changed / newly prescribed medications were explained.  A complete medication list was provided to the patient / caregiver. Prescriptions: MOVIPREP 100 GM  SOLR (PEG-KCL-NACL-NASULF-NA ASC-C) As per prep instructions.  #1 x 0   Entered by:   Harlow Mares CMA (AAMA)   Authorized by:   Mardella Layman MD [facility]   Signed by:   Harlow Mares CMA (AAMA) on 06/09/2010   Method used:   Electronically to        [id number] (retail)       [address]       [place], [place]  [address]       Ph: [phone]       Fax: [phone]   RxID:   [id number]   Appended Document: Orders Update faxed add on slip to lab   Clinical Lists Changes  Orders: Added new Test order of T-Sprue Panel (Celiac Disease Aby Eval) (83516x3/[address]) - Signed Added new Test order of T-igA (55732) - Signed

## 2010-09-07 NOTE — Letter (Signed)
Summary: OV and labs/[facility]  OV and labs/[facility]   Imported By: Lester [place] 06/14/2010 10:23:02  _____________________________________________________________________  External Attachment:    Type:   Image     Comment:   External Document

## 2010-12-19 NOTE — Op Note (Signed)
NAMEBRANDILYNN, TAORMINA              ACCOUNT NO.:  [id number]   MEDICAL RECORD NO.:  [id number]          PATIENT TYPE:  AMB   LOCATION:  DAY                          FACILITY:  [facility]   PHYSICIAN:  Sigmund I. Patsi Sears, M.D.DATE OF BIRTH:  06/03/1956   DATE OF PROCEDURE:  04/01/2007  DATE OF DISCHARGE:                               OPERATIVE REPORT   PREOPERATIVE DIAGNOSIS:  Impacted, nonprogressing right upper ureteral  calculus.   POSTOPERATIVE DIAGNOSIS:  Impacted, nonprogressing right upper ureteral  calculus.   OPERATION:  Cystourethroscopy, right retrograde pyelogram with  interpretation, balloon dilation of right lower ureter, right  ureteroscopy, basket extraction of right upper ureteral calculus, right  double-J catheter (5-French x 24 cm Polaris stent).   SURGEON:  Dr. Jethro Bolus.   ANESTHESIA:  General LMA.   PREPARATION:  After appropriate preanesthesia, the patient is brought to  the operating room, placed on the operating table in the dorsal supine  position where general LMA anesthesia was introduced.  She was then  replaced in dorsal lithotomy position where the pubis was prepped with  Betadine solution and draped in the usual fashion.   REVIEW OF HISTORY:  This 55 year old female has a history of right upper  ureteral calculus, measuring 6 mm, with recurrent hematuria, colic.  The  stone as not progressing.  It is not well identified by KUB, it is not  thought to be a good candidate for lithotripsy.  She is now for  ureteroscopy and basket extraction.   PROCEDURE:  Cystourethroscopy was accomplished, right retrograde  pyelogram was performed, which shows probable stone in the right upper  ureter, as identified on CT scan.  The ureteroscope could not be passed  through the ureteral orifice, and therefore, a 10 cm balloon dilator was  passed, and 3 minutes of balloon dilation was accomplished at 16  atmospheres of pressure.  Following this, the long  ureteroscope was  passed in the lower ureter, and ureteroscopy revealed an edematous lower  ureter, and hydronephrotic upper ureter. A stone was identified in the  right upper ureter, and photo documented.  It appeared with clot on it.  A guide basket was passed around the stone, the stone was dragged into  the right lower ureter.  The stone was broken into one smaller piece and  one larger piece, and these pieces were basket extracted.  Following  this, repeat ureteroscopy revealed two tiny fragments of stones still  remaining in the ureter.  These will pass on their own, as they could  not be basket extracted easily.  It was elected to leave a double-J  catheter in place, and a 5-French x 24 cm [place] scientific Polaris  stent was passed into the renal pelvis, and into the bladder.  Following  successful placement of the stent, the patient was given IV Toradol,  awakened and taken to the recovery room in good condition.     Sigmund I. Patsi Sears, M.D.  Electronically Signed    SIT/MEDQ  D:  04/01/2007  T:  04/01/2007  Job:  [id number]

## 2011-05-15 ENCOUNTER — Ambulatory Visit (INDEPENDENT_AMBULATORY_CARE_PROVIDER_SITE_OTHER): Payer: BC Managed Care – PPO | Admitting: Gastroenterology

## 2011-05-15 ENCOUNTER — Other Ambulatory Visit (INDEPENDENT_AMBULATORY_CARE_PROVIDER_SITE_OTHER): Payer: BC Managed Care – PPO

## 2011-05-15 ENCOUNTER — Encounter: Payer: Self-pay | Admitting: Gastroenterology

## 2011-05-15 VITALS — BP 118/64 | HR 60 | Ht 69.0 in | Wt 164.0 lb

## 2011-05-15 DIAGNOSIS — A048 Other specified bacterial intestinal infections: Secondary | ICD-10-CM

## 2011-05-15 LAB — CBC WITH DIFFERENTIAL/PLATELET
Basophils Relative: 0.8 % (ref 0.0–3.0)
Eosinophils Absolute: 0.1 10*3/uL (ref 0.0–0.7)
Eosinophils Relative: 0.8 % (ref 0.0–5.0)
Monocytes Absolute: 0.4 10*3/uL (ref 0.1–1.0)
Neutro Abs: 3.1 10*3/uL (ref 1.4–7.7)

## 2011-05-15 LAB — BASIC METABOLIC PANEL
CO2: 32 mEq/L (ref 19–32)
Glucose, Bld: 91 mg/dL (ref 70–99)
Potassium: 4 mEq/L (ref 3.5–5.1)
Sodium: 141 mEq/L (ref 135–145)

## 2011-05-15 LAB — HEPATIC FUNCTION PANEL
ALT: 20 U/L (ref 0–35)
AST: 23 U/L (ref 0–37)
Albumin: 4.2 g/dL (ref 3.5–5.2)
Alkaline Phosphatase: 65 U/L (ref 39–117)
Total Protein: 7.2 g/dL (ref 6.0–8.3)

## 2011-05-15 LAB — TSH: TSH: 1.87 u[IU]/mL (ref 0.35–5.50)

## 2011-05-15 MED ORDER — CILIDINIUM-CHLORDIAZEPOXIDE 2.5-5 MG PO CAPS: 1.0000 | ORAL_CAPSULE | Freq: Three times a day (TID) | ORAL | Status: DC | PRN

## 2011-05-15 MED ORDER — RABEPRAZOLE SODIUM 20 MG PO TBEC: 20.0000 mg | DELAYED_RELEASE_TABLET | Freq: Every day | ORAL | Status: DC

## 2011-05-15 NOTE — Patient Instructions (Addendum)
You have been scheduled and instructed for a Breath test to recheck for H Pylori.  05/16/11 9 am  GI 3rd floor. You will have labs done today at your basement lab. Your prescription for Librax has been sent to your pharmacy. Aciphex samples have been given to you to take once per day 20-30 minutes before breakfast,  Start on 05/16/11 after your breath test. Align samples have been given to you to take once per day. Start on 05/16/11 after your breath test. Please follow up in 2 weeks

## 2011-05-15 NOTE — Progress Notes (Signed)
This is a pleasant 55 year old Caucasian female with fibromyalgia and chronic depression. I did previous endoscopy and colonoscopy earlier this year. She was found to have H. pylori gastritis and was treated with 10 days of triple drug therapy. She also had arrest of esophagitis but could not afford Dexilant has been using when necessary Tagamet. She continues with epigastric burning, regurgitation, abdominal gas and bloating, periodic diarrhea, and right upper quadrant pain. She denies melena or hematochezia or any specific hepatobiliary complaints. Previous ultrasound was reviewed and is unremarkable. She denies abuse of NSAIDs, and uses alcohol socially. Previous visualization of the pancreas was normal. No history of known pancreatitis or hepatitis, or family history of any gastrointestinal problems. Previous evaluation for celiac disease was negative.  Current Medications, Allergies, Past Medical History, Past Surgical History, Family History and Social History were reviewed in [employer] record.  Pertinent Review of Systems Negative... her depression and fibromyalgia seem to be doing well with her current medications. He denies any specific food intolerances.   Physical Exam: Awake and alert no acute distress. She appears younger than her stated age. I cannot appreciate hepatosplenomegaly, abdominal masses or tenderness. Bowel sounds are normal. There are no stigmata of chronic liver disease noted. Mental status is entirely normal.    Assessment and Plan: Chronic hyperacidity with acid reflux, possibly exacerbated by treatment for H. pylori. She also has an element of probable IBS. I have placed her on AcipHex 20 mg a day, samples given, Librax 3 times a day before meals, probiotic therapy, and we will check screening labs and followup breath test for H. pylori. I will see her back in several weeks' time to review her workup and clinical status. No diagnosis found.

## 2011-05-16 ENCOUNTER — Ambulatory Visit (INDEPENDENT_AMBULATORY_CARE_PROVIDER_SITE_OTHER): Payer: BC Managed Care – PPO | Admitting: Gastroenterology

## 2011-05-16 DIAGNOSIS — K219 Gastro-esophageal reflux disease without esophagitis: Secondary | ICD-10-CM

## 2011-05-16 NOTE — Progress Notes (Signed)
Patient arrived for breath test.

## 2011-05-18 LAB — HEMOGLOBIN AND HEMATOCRIT, BLOOD: HCT: 37.7

## 2011-05-24 ENCOUNTER — Encounter: Payer: Self-pay | Admitting: Gastroenterology

## 2011-06-07 ENCOUNTER — Ambulatory Visit (INDEPENDENT_AMBULATORY_CARE_PROVIDER_SITE_OTHER): Payer: BC Managed Care – PPO | Admitting: Gastroenterology

## 2011-06-07 ENCOUNTER — Encounter: Payer: Self-pay | Admitting: Gastroenterology

## 2011-06-07 VITALS — BP 130/80 | HR 60 | Ht 68.75 in | Wt 168.0 lb

## 2011-06-07 DIAGNOSIS — K219 Gastro-esophageal reflux disease without esophagitis: Secondary | ICD-10-CM

## 2011-06-07 DIAGNOSIS — K589 Irritable bowel syndrome without diarrhea: Secondary | ICD-10-CM

## 2011-06-07 DIAGNOSIS — R5383 Other fatigue: Secondary | ICD-10-CM

## 2011-06-07 MED ORDER — CYANOCOBALAMIN 1000 MCG/ML IJ SOLN
1000.0000 ug | Freq: Once | INTRAMUSCULAR | Status: AC
  Administered 2011-06-07: 1000 ug via INTRAMUSCULAR

## 2011-06-07 MED ORDER — RABEPRAZOLE SODIUM 20 MG PO TBEC: 20.0000 mg | DELAYED_RELEASE_TABLET | Freq: Every day | ORAL | Status: DC

## 2011-06-07 NOTE — Progress Notes (Signed)
History of Present Illness: This is a very pleasant 55 year old Caucasian female recently treated for H. pylori gastritis with subsequent hydrogen breath testing showing H. pylori eradication. She also has IBS and is much better currently on when necessary Librax, and AcipHex 20 mg a day for GERD. Extensive lab review has been unremarkable so 4 borderline serum B12 level, and she apparently was on previous B12 injections. Her appetite is good her weight is stable and she denies significant GI issues at this time. She continues with mild constipation, and had colonoscopy one year ago that was negative except for diverticulosis. She has not begun fiber supplements as recommended.    Current Medications, Allergies, Past Medical History, Past Surgical History, Family History and Social History were reviewed in [employer] record.   Assessment and plan: IBS under good control. I have given her a B12 shot today 1000 mcg. Eburnated eroded medications and reviewed her labs with her, and given her a copy of her labs. Evaluation for celiac disease has been negative. I have recommended that she take daily Benefiber and liberalize her by mouth fluid intake per her mild constipation. I will see her yearly or when necessary as needed. Please copy her primary care physician, referring physician, and pertinent subspecialists. Dr. Tracey Harries.  Encounter Diagnosis  Name Primary?  . Fatigue Yes

## 2011-06-07 NOTE — Patient Instructions (Signed)
You are getting a B12 injection today Follow up as needed Call if you need any prescription refills We are giving you a copy of your labs today

## 2011-06-15 ENCOUNTER — Telehealth: Payer: Self-pay | Admitting: Gastroenterology

## 2011-06-15 MED ORDER — RABEPRAZOLE SODIUM 20 MG PO TBEC: 20.0000 mg | DELAYED_RELEASE_TABLET | Freq: Every day | ORAL | Status: AC

## 2011-06-15 NOTE — Telephone Encounter (Signed)
Reordered Aciphex; pt aware.

## 2011-06-19 ENCOUNTER — Telehealth: Payer: Self-pay | Admitting: Gastroenterology

## 2011-06-19 MED ORDER — DEXLANSOPRAZOLE 60 MG PO CPDR: DELAYED_RELEASE_CAPSULE | ORAL | Status: DC

## 2011-06-19 NOTE — Telephone Encounter (Signed)
Pt reports the Aciphex is $300 and she can't afford it. Pt reports she has a $5K deductible and can't get much off, but stated she took Dexilant earlier this year and it was $75. Called her in a script for Dexilant, left her some samples up front with discount cards.

## 2011-11-14 ENCOUNTER — Other Ambulatory Visit: Payer: Self-pay | Admitting: Gastroenterology

## 2011-11-15 ENCOUNTER — Telehealth: Payer: Self-pay | Admitting: Gastroenterology

## 2011-11-15 ENCOUNTER — Telehealth: Payer: Self-pay | Admitting: Internal Medicine

## 2011-11-15 NOTE — Telephone Encounter (Signed)
error 

## 2011-11-15 NOTE — Telephone Encounter (Signed)
rx ready for pt to pick up at the pharm. Left message for pt

## 2012-02-12 ENCOUNTER — Other Ambulatory Visit: Payer: Self-pay | Admitting: Gastroenterology

## 2012-03-05 ENCOUNTER — Other Ambulatory Visit: Payer: Self-pay | Admitting: Gastroenterology

## 2012-05-27 ENCOUNTER — Other Ambulatory Visit: Payer: Self-pay | Admitting: Physician Assistant

## 2012-05-27 DIAGNOSIS — Z1231 Encounter for screening mammogram for malignant neoplasm of breast: Secondary | ICD-10-CM

## 2012-07-08 ENCOUNTER — Ambulatory Visit
Admission: RE | Admit: 2012-07-08 | Discharge: 2012-07-08 | Disposition: A | Discharge status: Home / Self Care | Payer: BC Managed Care – PPO | Source: Ambulatory Visit | Attending: Physician Assistant | Admitting: Physician Assistant

## 2012-07-08 DIAGNOSIS — Z1231 Encounter for screening mammogram for malignant neoplasm of breast: Secondary | ICD-10-CM

## 2012-08-07 ENCOUNTER — Encounter (HOSPITAL_COMMUNITY): Payer: Self-pay

## 2012-08-07 ENCOUNTER — Encounter (HOSPITAL_COMMUNITY): Payer: Self-pay | Admitting: Pharmacy Technician

## 2012-08-07 ENCOUNTER — Encounter (HOSPITAL_COMMUNITY)
Admission: RE | Admit: 2012-08-07 | Discharge: 2012-08-07 | Disposition: A | Discharge status: Home / Self Care | Payer: BC Managed Care – PPO | Source: Ambulatory Visit | Attending: Orthopedic Surgery | Admitting: Orthopedic Surgery

## 2012-08-07 HISTORY — DX: Other specified postprocedural states: Z98.890

## 2012-08-07 HISTORY — DX: Nausea with vomiting, unspecified: R11.2

## 2012-08-07 HISTORY — DX: Fibromyalgia: M79.7

## 2012-08-07 HISTORY — DX: Hypothyroidism, unspecified: E03.9

## 2012-08-07 LAB — APTT: aPTT: 31 seconds (ref 24–37)

## 2012-08-07 LAB — PROTIME-INR
INR: 0.97 (ref 0.00–1.49)
Prothrombin Time: 12.8 seconds (ref 11.6–15.2)

## 2012-08-07 LAB — BASIC METABOLIC PANEL
BUN: 13 mg/dL (ref 6–23)
Creatinine, Ser: 0.6 mg/dL (ref 0.50–1.10)
GFR calc Af Amer: >90 mL/min (ref >90)
GFR calc non Af Amer: >90 mL/min (ref >90)
Potassium: 4.5 mEq/L (ref 3.5–5.1)

## 2012-08-07 LAB — URINALYSIS, ROUTINE W REFLEX MICROSCOPIC
Bilirubin Urine: NEGATIVE
Ketones, ur: NEGATIVE mg/dL
Nitrite: NEGATIVE
Protein, ur: NEGATIVE mg/dL
Urobilinogen, UA: 0.2 mg/dL (ref 0.0–1.0)

## 2012-08-07 LAB — CBC
Hemoglobin: 12.2 g/dL (ref 12.0–15.0)
MCHC: 33.8 g/dL (ref 30.0–36.0)
RDW: 12 % (ref 11.5–15.5)

## 2012-08-07 NOTE — Progress Notes (Signed)
Surgery clearance note 05/13/12 Elpidio Anis PA on chart

## 2012-08-07 NOTE — Patient Instructions (Addendum)
20 Rachael Sutton  08/07/2012   Your procedure is scheduled on: 08/19/12  Report to [facility] at 0615 AM.  Call this number if you have problems the morning of surgery 336-: [phone]   Remember:   Do not eat food or drink liquids After Midnight.     Take these medicines the morning of surgery with A SIP OF WATER: dexilant, synthroid, claritin, tramadol if needed   Do not wear jewelry, make-up or nail polish.  Do not wear lotions, powders, or perfumes. You may wear deodorant.  Do not shave 48 hours prior to surgery. Men may shave face and neck.  Do not bring valuables to the hospital.  Contacts, dentures or bridgework may not be worn into surgery.  Leave suitcase in the car. After surgery it may be brought to your room.  For patients admitted to the hospital, checkout time is 11:00 AM the day of discharge.    Please read over the following fact sheets that you were given: MRSA Information, blood fact sheet, incentive spirometer fact sheet Birdie Sons, RN  pre op nurse call if needed [phone]    FAILURE TO FOLLOW THESE INSTRUCTIONS MAY RESULT IN CANCELLATION OF YOUR SURGERY   Patient Signature: ___________________________________________

## 2012-08-14 NOTE — H&P (Signed)
TOTAL HIP ADMISSION H&P  Patient is admitted for right total hip arthroplasty, anterior approach.  Subjective:  Chief Complaint: Right hip OA / pain  HPI: Rachael Sutton, 57 y.o. female, has a history of pain and functional disability in the right hip(s) due to arthritis and patient has failed non-surgical conservative treatments for greater than 12 weeks to include NSAID's and/or analgesics, corticosteriod injections and activity modification.  Onset of symptoms was gradual starting 4 years ago with gradually worsening course since that time.The patient noted prior procedures of the hip to include unknown surgery on the femur on the right hip(s).  Patient currently rates pain in the right hip at 8 out of 10 with activity. Patient has night pain, worsening of pain with activity and weight bearing, trendelenberg gait, pain that interfers with activities of daily living and pain with passive range of motion. Patient has evidence of periarticular osteophytes and joint space narrowing by imaging studies. This condition presents safety issues increasing the risk of falls. There is no current active infection.  Risks, benefits and expectations were discussed with the patient. Patient understand the risks, benefits and expectations and wishes to proceed with surgery.   D/C Plans:  Home with HHPT  Post-op Meds:   Rx given for ASA, Robaxin, Celebrex, Iron, Colace and MiraLax  Tranexamic Acid:  To be given  Decadron:   To be given   FYI:    States that Norco has never helped any of her pain from previous surgeries and general pain.  Has taken Oxycodone previously for surgery and it does help her pain.   Patient Active Problem List   Diagnosis Date Noted  . Fatigue 06/07/2011  . IBS (irritable bowel syndrome) 06/07/2011  . GERD (gastroesophageal reflux disease) 06/07/2011  . HIATAL HERNIA 07/19/2010  . IBS 06/09/2010  . HYPERCHOLESTEROLEMIA 06/07/2010  . INSOMNIA 06/07/2010  . FATIGUE  06/07/2010  . ABDOMINAL PAIN 06/07/2010  . SYMPTOM, COLIC, RENAL 03/25/2007  . INFECTION, URINARY TRACT NOS 03/21/2007  . ANXIETY STATE NOS 03/07/2007  . CHEST PAIN, ATYPICAL, HX OF 03/07/2007  . HEMATURIA 03/03/2007  . SMOKER 12/18/2006  . DEPRESSION 12/18/2006  . DEGENERATIVE JOINT DISEASE 12/18/2006  . FIBROMYALGIA 12/18/2006  . TAH/BSO, HX OF 12/18/2006   Past Medical History  Diagnosis Date  . Diaphragmatic hernia without mention of obstruction or gangrene   . Hepatomegaly   . Esophageal reflux   . Irritable bowel syndrome   . Insomnia, unspecified   . Abdominal pain, unspecified site   . Pure hypercholesterolemia   . Other malaise and fatigue   . Renal colic   . Urinary tract infection, site not specified   . Other specified personal history presenting hazards to health   . Anxiety state, unspecified   . Hematuria   . Acquired absence of organ, genital organs   . Tobacco use disorder   . Depressive disorder, not elsewhere classified   . Osteoarthrosis, unspecified whether generalized or localized, unspecified site   . Myalgia and myositis, unspecified   . Hiatal hernia 07-19-2010    EGD   . Diverticulitis 07-19-2010    Colonoscopy   . H. pylori infection     Hx of   . Hypothyroidism   . Fibromyalgia   . PONV (postoperative nausea and vomiting)     Past Surgical History  Procedure Date  . Oophorectomy 2000  . Abdominal hysterectomy 2000  . Laparoscopy [id number]  . Abdominal hysterectomy 12 years ago  . Kidney stone removed  6 years ago  . Foot surgery     right  . Eye surgery     lasic    No prescriptions prior to admission   Allergies  Allergen Reactions  . Penicillins     REACTION: HIVES, ANAPHYLAXIS  . Zyrtec (Cetirizine) Other (See Comments)    drowsy    History  Substance Use Topics  . Smoking status: Former Smoker -- 2.0 packs/day for 40 years    Types: Cigarettes    Quit date: 08/16/2007  . Smokeless tobacco: Never Used  . Alcohol Use:  Yes     Comment: occ    Family History  Problem Relation Age of Onset  . Diabetes Father   . Heart disease Father   . Colon cancer Neg Hx   . Ovarian cancer Mother   . Stomach cancer Maternal Aunt      Review of Systems  Constitutional: Negative.   HENT: Negative.   Eyes: Negative.   Respiratory: Negative.   Cardiovascular: Negative.   Gastrointestinal: Negative.   Genitourinary: Negative.   Musculoskeletal: Positive for myalgias and joint pain.  Skin: Negative.   Neurological: Negative.   Endo/Heme/Allergies: Positive for environmental allergies.  Psychiatric/Behavioral: Negative.     Objective:  Physical Exam  Constitutional: She is oriented to person, place, and time. She appears well-developed and well-nourished.  HENT:  Head: Normocephalic and atraumatic.  Mouth/Throat: Oropharynx is clear and moist.  Eyes: Pupils are equal, round, and reactive to light.  Neck: Neck supple. No JVD present. No tracheal deviation present. No thyromegaly present.  Cardiovascular: Normal rate, regular rhythm, normal heart sounds and intact distal pulses.   Respiratory: Effort normal and breath sounds normal. No stridor. No respiratory distress. She has no wheezes.  GI: Soft. There is no tenderness. There is no guarding.  Musculoskeletal:       Right hip: She exhibits decreased range of motion, decreased strength, tenderness and bony tenderness. She exhibits no swelling, no deformity and no laceration.  Lymphadenopathy:    She has no cervical adenopathy.  Neurological: She is alert and oriented to person, place, and time.  Skin: Skin is warm and dry.  Psychiatric: She has a normal mood and affect.    Labs:  Estimated Body mass index is 24.99 kg/(m^2) as calculated from the following:   Height as of 06/07/11: 5' 8.75"(1.746 m).   Weight as of 06/07/11: 168 lb(76.204 kg).   Imaging Review Plain radiographs demonstrate severe degenerative joint disease of the right hip(s). The bone  quality appears to be good for age and reported activity level.  Assessment/Plan:  End stage arthritis, right hip(s)  The patient history, physical examination, clinical judgement of the provider and imaging studies are consistent with end stage degenerative joint disease of the right hip(s) and total hip arthroplasty is deemed medically necessary. The treatment options including medical management, injection therapy, arthroscopy and arthroplasty were discussed at length. The risks and benefits of total hip arthroplasty were presented and reviewed. The risks due to aseptic loosening, infection, stiffness, dislocation/subluxation,  thromboembolic complications and other imponderables were discussed.  The patient acknowledged the explanation, agreed to proceed with the plan and consent was signed. Patient is being admitted for inpatient treatment for surgery, pain control, PT, OT, prophylactic antibiotics, VTE prophylaxis, progressive ambulation and ADL's and discharge planning.The patient is planning to be discharged home with home health services.    Anastasio Auerbach Nik Gorrell   PAC  08/14/2012, 12:59 PM

## 2012-08-18 NOTE — Progress Notes (Signed)
Pt made aware of changed surgery time. Time to arrive=0515am. Pt verbally agreed with no questions or concerns.

## 2012-08-19 ENCOUNTER — Ambulatory Visit (HOSPITAL_COMMUNITY): Payer: BC Managed Care – PPO | Admitting: Anesthesiology

## 2012-08-19 ENCOUNTER — Inpatient Hospital Stay (HOSPITAL_COMMUNITY): Payer: BC Managed Care – PPO

## 2012-08-19 ENCOUNTER — Inpatient Hospital Stay (HOSPITAL_COMMUNITY)
Admission: RE | Admit: 2012-08-19 | Discharge: 2012-08-20 | DRG: 818 | Disposition: A | Discharge status: Home Health | Payer: BC Managed Care – PPO | Source: Ambulatory Visit | Attending: Orthopedic Surgery | Admitting: Orthopedic Surgery

## 2012-08-19 ENCOUNTER — Encounter (HOSPITAL_COMMUNITY)
Admission: RE | Disposition: A | Discharge status: Home Health | Payer: Self-pay | Source: Ambulatory Visit | Attending: Orthopedic Surgery

## 2012-08-19 ENCOUNTER — Ambulatory Visit (HOSPITAL_COMMUNITY): Payer: BC Managed Care – PPO

## 2012-08-19 ENCOUNTER — Encounter (HOSPITAL_COMMUNITY): Payer: Self-pay | Admitting: *Deleted

## 2012-08-19 ENCOUNTER — Encounter (HOSPITAL_COMMUNITY): Payer: Self-pay | Admitting: Anesthesiology

## 2012-08-19 DIAGNOSIS — Z9079 Acquired absence of other genital organ(s): Secondary | ICD-10-CM

## 2012-08-19 DIAGNOSIS — F411 Generalized anxiety disorder: Secondary | ICD-10-CM | POA: Diagnosis present

## 2012-08-19 DIAGNOSIS — D500 Iron deficiency anemia secondary to blood loss (chronic): Secondary | ICD-10-CM

## 2012-08-19 DIAGNOSIS — R160 Hepatomegaly, not elsewhere classified: Secondary | ICD-10-CM | POA: Diagnosis present

## 2012-08-19 DIAGNOSIS — M169 Osteoarthritis of hip, unspecified: Principal | ICD-10-CM | POA: Diagnosis present

## 2012-08-19 DIAGNOSIS — E663 Overweight: Secondary | ICD-10-CM

## 2012-08-19 DIAGNOSIS — J309 Allergic rhinitis, unspecified: Secondary | ICD-10-CM | POA: Diagnosis present

## 2012-08-19 DIAGNOSIS — K449 Diaphragmatic hernia without obstruction or gangrene: Secondary | ICD-10-CM | POA: Diagnosis present

## 2012-08-19 DIAGNOSIS — F3289 Other specified depressive episodes: Secondary | ICD-10-CM | POA: Diagnosis present

## 2012-08-19 DIAGNOSIS — Z96649 Presence of unspecified artificial hip joint: Secondary | ICD-10-CM

## 2012-08-19 DIAGNOSIS — M1610 Unilateral primary osteoarthritis, unspecified hip: Principal | ICD-10-CM | POA: Diagnosis present

## 2012-08-19 DIAGNOSIS — IMO0001 Reserved for inherently not codable concepts without codable children: Secondary | ICD-10-CM | POA: Diagnosis present

## 2012-08-19 DIAGNOSIS — F329 Major depressive disorder, single episode, unspecified: Secondary | ICD-10-CM | POA: Diagnosis present

## 2012-08-19 DIAGNOSIS — Z888 Allergy status to other drugs, medicaments and biological substances status: Secondary | ICD-10-CM

## 2012-08-19 DIAGNOSIS — Z87442 Personal history of urinary calculi: Secondary | ICD-10-CM

## 2012-08-19 DIAGNOSIS — K219 Gastro-esophageal reflux disease without esophagitis: Secondary | ICD-10-CM | POA: Diagnosis present

## 2012-08-19 DIAGNOSIS — Z8719 Personal history of other diseases of the digestive system: Secondary | ICD-10-CM

## 2012-08-19 DIAGNOSIS — Z87891 Personal history of nicotine dependence: Secondary | ICD-10-CM

## 2012-08-19 DIAGNOSIS — E039 Hypothyroidism, unspecified: Secondary | ICD-10-CM | POA: Diagnosis present

## 2012-08-19 DIAGNOSIS — Z880 Allergy status to penicillin: Secondary | ICD-10-CM

## 2012-08-19 DIAGNOSIS — Z6825 Body mass index (BMI) 25.0-25.9, adult: Secondary | ICD-10-CM

## 2012-08-19 DIAGNOSIS — E7800 Pure hypercholesterolemia, unspecified: Secondary | ICD-10-CM | POA: Diagnosis present

## 2012-08-19 DIAGNOSIS — Z90710 Acquired absence of both cervix and uterus: Secondary | ICD-10-CM

## 2012-08-19 DIAGNOSIS — D62 Acute posthemorrhagic anemia: Secondary | ICD-10-CM | POA: Diagnosis not present

## 2012-08-19 HISTORY — PX: TOTAL HIP ARTHROPLASTY: SHX124

## 2012-08-19 LAB — TYPE AND SCREEN
ABO/RH(D): A POS
Antibody Screen: NEGATIVE

## 2012-08-19 LAB — ABO/RH: ABO/RH(D): A POS

## 2012-08-19 SURGERY — ARTHROPLASTY, HIP, TOTAL, ANTERIOR APPROACH
Anesthesia: Spinal | Site: Hip | Laterality: Right | Wound class: Clean

## 2012-08-19 MED ORDER — MIDAZOLAM HCL 5 MG/5ML IJ SOLN
INTRAMUSCULAR | Status: DC | PRN
  Administered 2012-08-19: 2 mg via INTRAVENOUS

## 2012-08-19 MED ORDER — PROPOFOL 10 MG/ML IV EMUL
INTRAVENOUS | Status: DC | PRN
  Administered 2012-08-19: 100 ug/kg/min via INTRAVENOUS

## 2012-08-19 MED ORDER — CLINDAMYCIN PHOSPHATE 900 MG/50ML IV SOLN
900.0000 mg | INTRAVENOUS | Status: AC
  Administered 2012-08-19: 900 mg via INTRAVENOUS

## 2012-08-19 MED ORDER — DOCUSATE SODIUM 100 MG PO CAPS
100.0000 mg | ORAL_CAPSULE | Freq: Two times a day (BID) | ORAL | Status: DC
  Administered 2012-08-19 – 2012-08-20 (×2): 100 mg via ORAL

## 2012-08-19 MED ORDER — SODIUM CHLORIDE 0.9 % IV SOLN
INTRAVENOUS | Status: DC
  Administered 2012-08-19 – 2012-08-20 (×2): via INTRAVENOUS
  Filled 2012-08-19 (×9): qty 1000

## 2012-08-19 MED ORDER — MEPERIDINE HCL 50 MG/ML IJ SOLN: 6.2500 mg | INTRAMUSCULAR | Status: DC | PRN

## 2012-08-19 MED ORDER — BUPIVACAINE HCL (PF) 0.5 % IJ SOLN
INTRAMUSCULAR | Status: AC
  Filled 2012-08-19: qty 30

## 2012-08-19 MED ORDER — ACETAMINOPHEN 10 MG/ML IV SOLN
INTRAVENOUS | Status: DC | PRN
  Administered 2012-08-19: 1000 mg via INTRAVENOUS

## 2012-08-19 MED ORDER — DEXAMETHASONE SODIUM PHOSPHATE 10 MG/ML IJ SOLN
10.0000 mg | Freq: Once | INTRAMUSCULAR | Status: AC
  Administered 2012-08-19: 10 mg via INTRAVENOUS

## 2012-08-19 MED ORDER — DIPHENHYDRAMINE HCL 12.5 MG/5ML PO ELIX: 25.0000 mg | ORAL_SOLUTION | Freq: Four times a day (QID) | ORAL | Status: DC | PRN

## 2012-08-19 MED ORDER — PHENOL 1.4 % MT LIQD: 1.0000 | OROMUCOSAL | Status: DC | PRN

## 2012-08-19 MED ORDER — CLINDAMYCIN PHOSPHATE 900 MG/50ML IV SOLN
INTRAVENOUS | Status: AC
  Filled 2012-08-19: qty 50

## 2012-08-19 MED ORDER — ACETAMINOPHEN 10 MG/ML IV SOLN
INTRAVENOUS | Status: AC
  Filled 2012-08-19: qty 100

## 2012-08-19 MED ORDER — POLYETHYLENE GLYCOL 3350 17 G PO PACK: 17.0000 g | PACK | Freq: Every day | ORAL | Status: DC | PRN

## 2012-08-19 MED ORDER — ONDANSETRON HCL 4 MG/2ML IJ SOLN
INTRAMUSCULAR | Status: DC | PRN
  Administered 2012-08-19: 4 mg via INTRAVENOUS

## 2012-08-19 MED ORDER — METHOCARBAMOL 100 MG/ML IJ SOLN
500.0000 mg | Freq: Four times a day (QID) | INTRAVENOUS | Status: DC | PRN
  Administered 2012-08-19 – 2012-08-20 (×2): 500 mg via INTRAVENOUS
  Filled 2012-08-19 (×2): qty 5

## 2012-08-19 MED ORDER — 0.9 % SODIUM CHLORIDE (POUR BTL) OPTIME
TOPICAL | Status: DC | PRN
  Administered 2012-08-19: 1000 mL

## 2012-08-19 MED ORDER — PROMETHAZINE HCL 25 MG/ML IJ SOLN: 6.2500 mg | INTRAMUSCULAR | Status: DC | PRN

## 2012-08-19 MED ORDER — HYDROCODONE-ACETAMINOPHEN 5-325 MG PO TABS
1.0000 | ORAL_TABLET | ORAL | Status: DC
  Administered 2012-08-19: 1 via ORAL
  Administered 2012-08-19: 2 via ORAL
  Administered 2012-08-19: 1 via ORAL
  Administered 2012-08-20: 2 via ORAL
  Filled 2012-08-19 (×4): qty 2

## 2012-08-19 MED ORDER — DEXAMETHASONE SODIUM PHOSPHATE 10 MG/ML IJ SOLN
10.0000 mg | Freq: Once | INTRAMUSCULAR | Status: AC
  Administered 2012-08-20: 10 mg via INTRAVENOUS
  Filled 2012-08-19: qty 1

## 2012-08-19 MED ORDER — FENTANYL CITRATE 0.05 MG/ML IJ SOLN
INTRAMUSCULAR | Status: DC | PRN
  Administered 2012-08-19 (×2): 50 ug via INTRAVENOUS

## 2012-08-19 MED ORDER — HYDROMORPHONE HCL PF 1 MG/ML IJ SOLN
INTRAMUSCULAR | Status: AC
  Administered 2012-08-19: 1 mg
  Filled 2012-08-19: qty 2

## 2012-08-19 MED ORDER — ONDANSETRON HCL 4 MG PO TABS: 4.0000 mg | ORAL_TABLET | Freq: Four times a day (QID) | ORAL | Status: DC | PRN

## 2012-08-19 MED ORDER — KETAMINE HCL 10 MG/ML IJ SOLN
INTRAMUSCULAR | Status: DC | PRN
  Administered 2012-08-19 (×5): 10 mg via INTRAVENOUS

## 2012-08-19 MED ORDER — SENNA 8.6 MG PO TABS
1.0000 | ORAL_TABLET | Freq: Two times a day (BID) | ORAL | Status: DC
  Administered 2012-08-19 – 2012-08-20 (×2): 8.6 mg via ORAL
  Filled 2012-08-19 (×2): qty 1

## 2012-08-19 MED ORDER — LEVOTHYROXINE SODIUM 50 MCG PO TABS
50.0000 ug | ORAL_TABLET | Freq: Every day | ORAL | Status: DC
  Administered 2012-08-20: 50 ug via ORAL
  Filled 2012-08-19 (×2): qty 1

## 2012-08-19 MED ORDER — SODIUM CHLORIDE 0.9 % IV SOLN
15.0000 mg/kg | Freq: Once | INTRAVENOUS | Status: AC
  Administered 2012-08-19: 1170 mg via INTRAVENOUS
  Filled 2012-08-19: qty 11.7

## 2012-08-19 MED ORDER — LORAZEPAM 0.5 MG PO TABS
0.5000 mg | ORAL_TABLET | Freq: Every evening | ORAL | Status: DC | PRN
  Filled 2012-08-19: qty 1

## 2012-08-19 MED ORDER — PHENYLEPHRINE HCL 10 MG/ML IJ SOLN
10.0000 mg | INTRAVENOUS | Status: DC | PRN
  Administered 2012-08-19: 10 ug/min via INTRAVENOUS

## 2012-08-19 MED ORDER — HYDROMORPHONE HCL PF 1 MG/ML IJ SOLN
0.2500 mg | INTRAMUSCULAR | Status: DC | PRN
  Administered 2012-08-19 (×3): 0.5 mg via INTRAVENOUS

## 2012-08-19 MED ORDER — BUPIVACAINE HCL (PF) 0.5 % IJ SOLN
INTRAMUSCULAR | Status: DC | PRN
  Administered 2012-08-19: 3 mL

## 2012-08-19 MED ORDER — PANTOPRAZOLE SODIUM 40 MG PO TBEC
40.0000 mg | DELAYED_RELEASE_TABLET | Freq: Every day | ORAL | Status: DC
  Administered 2012-08-20: 40 mg via ORAL
  Filled 2012-08-19: qty 1

## 2012-08-19 MED ORDER — LACTATED RINGERS IV SOLN
INTRAVENOUS | Status: DC
  Administered 2012-08-19: 10:00:00 via INTRAVENOUS

## 2012-08-19 MED ORDER — CLINDAMYCIN PHOSPHATE 600 MG/50ML IV SOLN
600.0000 mg | Freq: Four times a day (QID) | INTRAVENOUS | Status: AC
  Administered 2012-08-19 (×2): 600 mg via INTRAVENOUS
  Filled 2012-08-19 (×2): qty 50

## 2012-08-19 MED ORDER — ONDANSETRON HCL 4 MG/2ML IJ SOLN: 4.0000 mg | Freq: Four times a day (QID) | INTRAMUSCULAR | Status: DC | PRN

## 2012-08-19 MED ORDER — ALUM & MAG HYDROXIDE-SIMETH 200-200-20 MG/5ML PO SUSP: 30.0000 mL | ORAL | Status: DC | PRN

## 2012-08-19 MED ORDER — LACTATED RINGERS IV SOLN
INTRAVENOUS | Status: DC | PRN
  Administered 2012-08-19 (×3): via INTRAVENOUS

## 2012-08-19 MED ORDER — MENTHOL 3 MG MT LOZG: 1.0000 | LOZENGE | OROMUCOSAL | Status: DC | PRN

## 2012-08-19 MED ORDER — FERROUS SULFATE 325 (65 FE) MG PO TABS
325.0000 mg | ORAL_TABLET | Freq: Three times a day (TID) | ORAL | Status: DC
  Administered 2012-08-19 – 2012-08-20 (×2): 325 mg via ORAL
  Filled 2012-08-19 (×6): qty 1

## 2012-08-19 MED ORDER — PHENYLEPHRINE HCL 10 MG/ML IJ SOLN
INTRAMUSCULAR | Status: DC | PRN
  Administered 2012-08-19 (×3): 40 ug via INTRAVENOUS

## 2012-08-19 MED ORDER — RIVAROXABAN 10 MG PO TABS
10.0000 mg | ORAL_TABLET | Freq: Every day | ORAL | Status: DC
  Administered 2012-08-20: 10 mg via ORAL
  Filled 2012-08-19 (×2): qty 1

## 2012-08-19 MED ORDER — HYDROMORPHONE HCL PF 1 MG/ML IJ SOLN
0.2000 mg | INTRAMUSCULAR | Status: DC | PRN
  Administered 2012-08-19 – 2012-08-20 (×5): 0.6 mg via INTRAVENOUS
  Filled 2012-08-19 (×6): qty 1

## 2012-08-19 MED ORDER — METHOCARBAMOL 500 MG PO TABS
500.0000 mg | ORAL_TABLET | Freq: Four times a day (QID) | ORAL | Status: DC | PRN
  Administered 2012-08-20: 500 mg via ORAL
  Filled 2012-08-19: qty 1

## 2012-08-19 SURGICAL SUPPLY — 40 items
ADH SKN CLS APL DERMABOND .7 (GAUZE/BANDAGES/DRESSINGS) ×1
BAG SPEC THK2 15X12 ZIP CLS (MISCELLANEOUS) ×2
BAG ZIPLOCK 12X15 (MISCELLANEOUS) ×4 IMPLANT
BLADE SAW SGTL 18X1.27X75 (BLADE) ×2 IMPLANT
CLOTH BEACON ORANGE TIMEOUT ST (SAFETY) ×2 IMPLANT
DERMABOND ADVANCED (GAUZE/BANDAGES/DRESSINGS) ×1
DERMABOND ADVANCED .7 DNX12 (GAUZE/BANDAGES/DRESSINGS) ×1 IMPLANT
DRAPE C-ARM 42X72 X-RAY (DRAPES) ×2 IMPLANT
DRAPE STERI IOBAN 125X83 (DRAPES) ×2 IMPLANT
DRAPE U-SHAPE 47X51 STRL (DRAPES) ×6 IMPLANT
DRSG AQUACEL AG ADV 3.5X10 (GAUZE/BANDAGES/DRESSINGS) ×2 IMPLANT
DRSG TEGADERM 4X4.75 (GAUZE/BANDAGES/DRESSINGS) ×2 IMPLANT
DURAPREP 26ML APPLICATOR (WOUND CARE) ×2 IMPLANT
ELECT BLADE TIP CTD 4 INCH (ELECTRODE) ×2 IMPLANT
ELECT REM PT RETURN 9FT ADLT (ELECTROSURGICAL) ×2
ELECTRODE REM PT RTRN 9FT ADLT (ELECTROSURGICAL) ×1 IMPLANT
EVACUATOR 1/8 PVC DRAIN (DRAIN) IMPLANT
FACESHIELD LNG OPTICON STERILE (SAFETY) ×8 IMPLANT
GAUZE SPONGE 2X2 8PLY STRL LF (GAUZE/BANDAGES/DRESSINGS) ×1 IMPLANT
GLOVE BIOGEL PI IND STRL 7.5 (GLOVE) ×1 IMPLANT
GLOVE BIOGEL PI IND STRL 8 (GLOVE) ×1 IMPLANT
GLOVE BIOGEL PI INDICATOR 7.5 (GLOVE) ×1
GLOVE BIOGEL PI INDICATOR 8 (GLOVE) ×1
GLOVE ECLIPSE 8.0 STRL XLNG CF (GLOVE) ×2 IMPLANT
GLOVE ORTHO TXT STRL SZ7.5 (GLOVE) ×4 IMPLANT
GOWN BRE IMP PREV XXLGXLNG (GOWN DISPOSABLE) ×4 IMPLANT
GOWN STRL NON-REIN LRG LVL3 (GOWN DISPOSABLE) ×2 IMPLANT
KIT BASIN OR (CUSTOM PROCEDURE TRAY) ×2 IMPLANT
PACK TOTAL JOINT (CUSTOM PROCEDURE TRAY) ×2 IMPLANT
PADDING CAST COTTON 6X4 STRL (CAST SUPPLIES) ×2 IMPLANT
SPONGE GAUZE 2X2 STER 10/PKG (GAUZE/BANDAGES/DRESSINGS) ×1
SUCTION FRAZIER 12FR DISP (SUCTIONS) ×2 IMPLANT
SUT MNCRL AB 4-0 PS2 18 (SUTURE) ×2 IMPLANT
SUT VIC AB 1 CT1 36 (SUTURE) ×8 IMPLANT
SUT VIC AB 2-0 CT1 27 (SUTURE) ×2
SUT VIC AB 2-0 CT1 TAPERPNT 27 (SUTURE) ×2 IMPLANT
SUT VLOC 180 0 24IN GS25 (SUTURE) ×2 IMPLANT
TOWEL OR 17X26 10 PK STRL BLUE (TOWEL DISPOSABLE) ×4 IMPLANT
TRAY FOLEY CATH 14FRSI W/METER (CATHETERS) ×2 IMPLANT
WATER STERILE IRR 1500ML POUR (IV SOLUTION) ×4 IMPLANT

## 2012-08-19 NOTE — Anesthesia Postprocedure Evaluation (Signed)
  Anesthesia Post-op Note  Patient: Rachael Sutton  Procedure(s) Performed: Procedure(s) (LRB): TOTAL HIP ARTHROPLASTY ANTERIOR APPROACH (Right)  Patient Location: PACU  Anesthesia Type: Spinal  Level of Consciousness: awake and alert   Airway and Oxygen Therapy: Patient Spontanous Breathing  Post-op Pain: mild  Post-op Assessment: Post-op Vital signs reviewed, Patient's Cardiovascular Status Stable, Respiratory Function Stable, Patent Airway and No signs of Nausea or vomiting  Last Vitals:  Filed Vitals:   08/19/12 1102  BP: 144/86  Pulse: 53  Temp: 36.3 C  Resp: 16    Post-op Vital Signs: stable   Complications: No apparent anesthesia complications

## 2012-08-19 NOTE — Interval H&P Note (Signed)
History and Physical Interval Note:  08/19/2012 6:55 AM  Rachael Sutton  has presented today for surgery, with the diagnosis of RIGHT HIP OA  The various methods of treatment have been discussed with the patient and family. After consideration of risks, benefits and other options for treatment, the patient has consented to  Procedure(s) (LRB) with comments: TOTAL HIP ARTHROPLASTY ANTERIOR APPROACH (Right) as a surgical intervention .  The patient's history has been reviewed, patient examined, no change in status, stable for surgery.  I have reviewed the patient's chart and labs.  Questions were answered to the patient's satisfaction.     Shelda Pal

## 2012-08-19 NOTE — Evaluation (Signed)
Occupational Therapy Evaluation Patient Details Name: AVI ARCHULETA MRN: [id number] DOB: Oct 31, 1955 Today's Date: 08/19/2012 Time: 6962-9528 OT Time Calculation (min): 26 min  OT Assessment / Plan / Recommendation Clinical Impression  Pt presents POD 0 RTHR (anterior direct). Pt mobilizing well. Skilled OT indicated to maximize independence with BADLs to supervision level in prep for d/c home.    OT Assessment  Patient needs continued OT Services    Follow Up Recommendations  No OT follow up    Barriers to Discharge      Equipment Recommendations  Other (comment) (TBD)    Recommendations for Other Services    Frequency  Min 2X/week    Precautions / Restrictions Precautions Precautions: None   Pertinent Vitals/Pain Reported 8/10 pain in R hip. Repositioned for comfort and cold applied.    ADL  Grooming: Min guard Where Assessed - Grooming: Supported standing Upper Body Bathing: Set up Where Assessed - Upper Body Bathing: Unsupported sitting Lower Body Bathing: Minimal assistance Where Assessed - Lower Body Bathing: Supported sit to stand Upper Body Dressing: Set up Where Assessed - Upper Body Dressing: Unsupported sitting Lower Body Dressing: Minimal assistance Where Assessed - Lower Body Dressing: Supported sit to stand Toilet Transfer: Hydrographic surveyor Method: Sit to Barista: Other (comment) Nurse, children's) Toileting - Architect and Hygiene: Min guard Where Assessed - Toileting Clothing Manipulation and Hygiene: Standing ADL Comments: Pt tolerated well. Ambulated around room and hall without dizziness or fatigue.    OT Diagnosis: Generalized weakness  OT Problem List: Decreased activity tolerance;Decreased knowledge of use of DME or AE;Pain OT Treatment Interventions: Self-care/ADL training;Therapeutic activities;DME and/or AE instruction;Patient/family education   OT Goals Acute Rehab OT Goals OT Goal Formulation: With  patient/family Time For Goal Achievement: 08/26/12 Potential to Achieve Goals: Good ADL Goals Pt Will Perform Grooming: with supervision;Standing at sink ADL Goal: Grooming - Progress: Goal set today Pt Will Perform Lower Body Bathing: with supervision;Sit to stand from bed;Sit to stand from chair ADL Goal: Lower Body Bathing - Progress: Goal set today Pt Will Perform Lower Body Dressing: with supervision;Sit to stand from chair;Sit to stand from bed ADL Goal: Lower Body Dressing - Progress: Goal set today Pt Will Transfer to Toilet: with supervision;Ambulation;with DME ADL Goal: Toilet Transfer - Progress: Goal set today Pt Will Perform Toileting - Clothing Manipulation: with supervision;Sitting on 3-in-1 or toilet;Standing ADL Goal: Toileting - Clothing Manipulation - Progress: Goal set today Pt Will Perform Toileting - Hygiene: with supervision;Sit to stand from 3-in-1/toilet ADL Goal: Toileting - Hygiene - Progress: Goal set today Pt Will Perform Tub/Shower Transfer: Shower transfer;with supervision;Ambulation ADL Goal: Tub/Shower Transfer - Progress: Goal set today  Visit Information  Last OT Received On: 08/19/12 Assistance Needed: +1 PT/OT Co-Evaluation/Treatment: Yes    Subjective Data  Subjective: I'm finally able to walk evenly. Patient Stated Goal: To return home tomorrow.   Prior Functioning     Home Living Lives With: Spouse Available Help at Discharge: Family Type of Home: House Home Access: Stairs to enter Secretary/administrator of Steps: 1 Home Layout: One level Bathroom Shower/Tub: Health visitor: Standard Home Adaptive Equipment: Shower chair with back;Walker - rolling;Straight cane Prior Function Level of Independence: Independent Able to Take Stairs?: Yes Driving: Yes Vocation: Retired Musician: No difficulties Dominant Hand: Right         Vision/Perception     Cognition  Overall Cognitive Status:  Appears within functional limits for tasks assessed/performed Arousal/Alertness: Awake/alert Orientation Level:  Appears intact for tasks assessed Behavior During Session: [facility] for tasks performed    Extremity/Trunk Assessment Right Upper Extremity Assessment RUE ROM/Strength/Tone: [facility] for tasks assessed Left Upper Extremity Assessment LUE ROM/Strength/Tone: [facility] for tasks assessed     Mobility Bed Mobility Bed Mobility: Supine to Sit Supine to Sit: 4: Min assist;HOB elevated;With rails Details for Bed Mobility Assistance: min A for RLE, cues for safety and technique. Transfers Transfers: Sit to Stand;Stand to Sit Sit to Stand: 4: Min guard;With upper extremity assist;From bed Stand to Sit: 4: Min guard;With upper extremity assist;To bed Details for Transfer Assistance: min cues for hand placement and LE management.     Shoulder Instructions     Exercise     Balance     End of Session OT - End of Session Activity Tolerance: Patient tolerated treatment well Patient left: in bed;with call bell/phone within reach;with family/visitor present  GO     Connor Foxworthy A OTR/L [phone] 08/19/2012, 3:39 PM

## 2012-08-19 NOTE — Progress Notes (Signed)
CARE MANAGEMENT NOTE 08/19/2012  Patient:  Rachael Sutton, Rachael Sutton   Account Number:  [id number]  Date Initiated:  08/19/2012  Documentation initiated by:  Colleen Can  Subjective/Objective Assessment:   dx right hip osteoarthritis; total hip replacemnt-anterior approach     Action/Plan:   Day of surgery; PT/OT pending. CM will follow   Anticipated DC Date:  08/21/2012   Anticipated DC Plan:  HOME W HOME HEALTH SERVICES      DC Planning Services  CM consult      Status of service:  In process, will continue to follow  Per UR Regulation:  Reviewed for med. necessity/level of care/duration of stay

## 2012-08-19 NOTE — Transfer of Care (Signed)
Immediate Anesthesia Transfer of Care Note  Patient: Rachael Sutton  Procedure(s) Performed: Procedure(s) (LRB) with comments: TOTAL HIP ARTHROPLASTY ANTERIOR APPROACH (Right)  Patient Location: PACU  Anesthesia Type:MAC and Regional  Level of Consciousness: awake, alert , oriented and patient cooperative  Airway & Oxygen Therapy: Patient Spontanous Breathing and Patient connected to face mask oxygen  Post-op Assessment: Report given to PACU RN and Post -op Vital signs reviewed and stable  Post vital signs: Reviewed and stable  Complications: No apparent anesthesia complications

## 2012-08-19 NOTE — Op Note (Signed)
NAME:  Rachael Sutton                ACCOUNT NO.: [id number]      MEDICAL RECORD NO.: [id number]      FACILITY:  [facility]      PHYSICIAN:  Durene Romans D  DATE OF BIRTH:  April 22, 1956     DATE OF PROCEDURE:  08/19/2012                                 OPERATIVE REPORT         PREOPERATIVE DIAGNOSIS: Right  hip osteoarthritis.      POSTOPERATIVE DIAGNOSIS:  Right hip osteoarthritis related to old Perthes condition of hip     PROCEDURE:  Right total hip replacement through an anterior approach   utilizing DePuy THR system, component size 52mm pinnacle cup, a size 36+4 neutral   Altrex liner, a size 4 Hi Tri Lock stem with a 36+1.5 delta ceramic   ball.      SURGEON:  Madlyn Frankel. Charlann Boxer, M.D.      ASSISTANT:  Leilani Able, PA-C      ANESTHESIA:  General.      SPECIMENS:  None.      COMPLICATIONS:  None.      BLOOD LOSS:  150 cc     DRAINS:  One Hemovac.      INDICATION OF THE PROCEDURE:  Rachael Sutton is a 57 y.o. female who had   presented to office for evaluation of right hip pain.  Radiographs revealed   progressive degenerative changes with bone-on-bone   articulation to the  hip joint.  The patient had painful limited range of   motion significantly affecting their overall quality of life.  The patient was failing to    respond to conservative measures, and at this point was ready   to proceed with more definitive measures.  The patient has noted progressive   degenerative changes in his hip, progressive problems and dysfunction   with regarding the hip prior to surgery.  Consent was obtained for   benefit of pain relief.  Specific risk of infection, DVT, component   failure, dislocation, need for revision surgery, as well discussion of   the anterior versus posterior approach were reviewed.  Consent was   obtained for benefit of anterior pain relief through an anterior   approach.      PROCEDURE IN DETAIL:  The patient was brought to operative  theater.   Once adequate anesthesia, preoperative antibiotics, 900mg  Clindamycin administered.   The patient was positioned supine on the OSI Hanna table.  Once adequate   padding of boney process was carried out, we had predraped out the hip, and  used fluoroscopy to confirm orientation of the pelvis and position.      The right hip was then prepped and draped from proximal iliac crest to   mid thigh with shower curtain technique.      Time-out was performed identifying the patient, planned procedure, and   extremity.     An incision was then made 2 cm distal and lateral to the   anterior superior iliac spine extending over the orientation of the   tensor fascia lata muscle and sharp dissection was carried down to the   fascia of the muscle and protractor placed in the soft tissues.      The fascia was then incised.  The muscle belly was identified and swept   laterally and retractor placed along the superior neck.  Following   cauterization of the circumflex vessels and removing some pericapsular   fat, a second cobra retractor was placed on the inferior neck.  A third   retractor was placed on the anterior acetabulum after elevating the   anterior rectus.  A L-capsulotomy was along the line of the   superior neck to the trochanteric fossa, then extended proximally and   distally.  Tag sutures were placed and the retractors were then placed   intracapsular.  We then identified the trochanteric fossa and   orientation of my neck cut, confirmed this radiographically   and then made a neck osteotomy with the femur on traction.  The femoral   head was removed without difficulty or complication.  Traction was let   off and retractors were placed posterior and anterior around the   acetabulum.      The labrum and foveal tissue were debrided.  I began reaming with a 45mm   reamer and reamed up to 51mm reamer with good bony bed preparation and a 52   cup was chosen.  The final 52mm  Pinnacle cup was then impacted under fluoroscopy  to confirm the depth of penetration and orientation with respect to   abduction.  A screw was placed followed by the hole eliminator.  The final   36+4 neutral Altrex liner was impacted with good visualized rim fit.  The cup was positioned anatomically within the acetabular portion of the pelvis.      At this point, the femur was rolled at 80 degrees.  Further capsule was   released off the inferior aspect of the femoral neck.  I then   released the superior capsule proximally.  The hook was placed laterally   along the femur and elevated manually and held in position with the bed   hook.  The leg was then extended and adducted with the leg rolled to 100   degrees of external rotation.  Once the proximal femur was fully   exposed, I used a box osteotome to set orientation.  I then began   broaching with the starting chili pepper broach and passed this by hand and then broached up to 4.  With the 4 broach in place I chose a high offset neck and did a trial reduction.  The offset was appropriate, leg lengths   appeared to be equal, confirmed radiographically.   Given these findings, I went ahead and dislocated the hip, repositioned all   retractors and positioned the right hip in the extended and abducted position.  The final 4Hi  Tri Lock stem was   chosen and it was impacted down to the level of neck cut.  Based on this   and the trial reduction, a 36+1.5 delta ceramic ball was chosen and   impacted onto a clean and dry trunnion, and the hip was reduced.  The   hip had been irrigated throughout the case again at this point.  I did   reapproximate the superior capsular leaflet to the anterior leaflet   using #1 Vicryl, placed a medium Hemovac drain deep.  The fascia of the   tensor fascia lata muscle was then reapproximated using #1 Vicryl.  The   remaining wound was closed with 2-0 Vicryl and running 4-0 Monocryl.   The hip was cleaned, dried,  and dressed sterilely using Dermabond and   Aquacel  dressing.  Drain site dressed separately.  She was then brought   to recovery room in stable condition tolerating the procedure well.    Leilani Able, PA-C was present for the entirety of the case involved from   preoperative positioning, perioperative retractor management, general   facilitation of the case, as well as primary wound closure as assistant.            Madlyn Frankel Charlann Boxer, M.D.            MDO/MEDQ  D:  05/29/2011  T:  05/29/2011  Job:  [id number]      Electronically Signed by Durene Romans M.D. on 06/04/2011 09:15:38 AM

## 2012-08-19 NOTE — Anesthesia Preprocedure Evaluation (Addendum)
Anesthesia Evaluation  Patient identified by MRN, date of birth, ID band Patient awake    Reviewed: Allergy & Precautions, H&P , NPO status , Patient's Chart, lab work & pertinent test results  History of Anesthesia Complications (+) PONV  Airway Mallampati: II TM Distance: >3 FB Neck ROM: Full    Dental No notable dental hx.    Pulmonary neg pulmonary ROS, former smoker,  breath sounds clear to auscultation  Pulmonary exam normal       Cardiovascular negative cardio ROS  Rhythm:Regular Rate:Normal     Neuro/Psych PSYCHIATRIC DISORDERS Anxiety Depression negative neurological ROS  negative psych ROS   GI/Hepatic negative GI ROS, Neg liver ROS, hiatal hernia,   Endo/Other  negative endocrine ROSHypothyroidism   Renal/GU negative Renal ROS  negative genitourinary   Musculoskeletal negative musculoskeletal ROS (+) Fibromyalgia -  Abdominal   Peds negative pediatric ROS (+)  Hematology negative hematology ROS (+)   Anesthesia Other Findings   Reproductive/Obstetrics negative OB ROS                          Anesthesia Physical Anesthesia Plan  ASA: II  Anesthesia Plan: Spinal   Post-op Pain Management:    Induction:   Airway Management Planned: Simple Face Mask  Additional Equipment:   Intra-op Plan:   Post-operative Plan:   Informed Consent: I have reviewed the patients History and Physical, chart, labs and discussed the procedure including the risks, benefits and alternatives for the proposed anesthesia with the patient or authorized representative who has indicated his/her understanding and acceptance.   Dental advisory given  Plan Discussed with: CRNA  Anesthesia Plan Comments:         Anesthesia Quick Evaluation

## 2012-08-19 NOTE — Evaluation (Signed)
Physical Therapy Evaluation Patient Details Name: Rachael Sutton MRN: [id number] DOB: 1955/12/27 Today's Date: 08/19/2012 Time: 4098-1191 PT Time Calculation (min): 41 min  PT Assessment / Plan / Recommendation Clinical Impression  Pt. is 57 yo femalw admitted 08/19/12 for direct anterior THA. Pt ambulated x 125 ft w/ RW. Ot did hav increase in pain. Pt hopeful to DC tomorrow. Pt will benefit fromPT while in acute care.    PT Assessment  Patient needs continued PT services    Follow Up Recommendations  Home health PT    Does the patient have the potential to tolerate intense rehabilitation      Barriers to Discharge None      Equipment Recommendations  None recommended by PT    Recommendations for Other Services     Frequency 7X/week    Precautions / Restrictions Precautions Precautions: None   Pertinent Vitals/Pain Pre pain ,3  post 9/10, ice applied, repositioned. Had IV meds just prior to PT session.      Mobility  Bed Mobility Bed Mobility: Sit to Supine Supine to Sit: 4: Min assist;HOB elevated;With rails Sit to Supine: 4: Min guard Details for Bed Mobility Assistance: min A for RLE, cues for safety and technique. Transfers Sit to Stand: 4: Min guard;With upper extremity assist;From bed Stand to Sit: 4: Min guard;With upper extremity assist;To bed Details for Transfer Assistance: min cues for hand placement and LE management. Ambulation/Gait Ambulation/Gait Assistance: 4: Min assist Ambulation Distance (Feet): 125 Feet Assistive device: Rolling walker Ambulation/Gait Assistance Details: cues for posture and for sequence, pt was able to advance RLE first. Gait Pattern: Step-through pattern;Decreased step length - right;Decreased stance time - right    Shoulder Instructions     Exercises Total Joint Exercises Quad Sets: AROM;Right;10 reps;Supine Heel Slides: AROM;Right;10 reps;Supine Hip ABduction/ADduction: AAROM;Right;10 reps;Supine   PT Diagnosis:  Difficulty walking;Acute pain  PT Problem List: Decreased strength;Decreased range of motion;Decreased activity tolerance;Decreased mobility;Pain;Decreased knowledge of use of DME PT Treatment Interventions: DME instruction;Gait training;Stair training;Functional mobility training;Therapeutic activities;Therapeutic exercise;Patient/family education   PT Goals Acute Rehab PT Goals PT Goal Formulation: With patient Time For Goal Achievement: 08/26/12 Potential to Achieve Goals: Good Pt will go Supine/Side to Sit: Independently PT Goal: Supine/Side to Sit - Progress: Goal set today Pt will go Sit to Supine/Side: Independently PT Goal: Sit to Supine/Side - Progress: Goal set today Pt will go Sit to Stand: with supervision PT Goal: Sit to Stand - Progress: Goal set today Pt will go Stand to Sit: with supervision PT Goal: Stand to Sit - Progress: Goal set today Pt will Ambulate: >150 feet;with supervision;with rolling walker PT Goal: Ambulate - Progress: Goal set today Pt will Go Up / Down Stairs: 1-2 stairs;with supervision;with rolling walker PT Goal: Up/Down Stairs - Progress: Goal set today Pt will Perform Home Exercise Program: Independently PT Goal: Perform Home Exercise Program - Progress: Goal set today  Visit Information  Last PT Received On: 08/19/12 Assistance Needed: +1    Subjective Data  Subjective: I cannot believe how I can bend my hip. Patient Stated Goal: to walk and go home tomorrow.   Prior Functioning  Home Living Lives With: Spouse Available Help at Discharge: Family Type of Home: House Home Access: Stairs to enter Secretary/administrator of Steps: 1 Home Layout: One level Bathroom Shower/Tub: Health visitor: Standard Home Adaptive Equipment: Shower chair with back;Walker - rolling;Straight cane Prior Function Level of Independence: Independent Able to Take Stairs?: Yes Driving: Yes Vocation: Retired Special educational needs teacher  Communication: No  difficulties Dominant Hand: Right    Cognition  Overall Cognitive Status: Appears within functional limits for tasks assessed/performed Arousal/Alertness: Awake/alert Orientation Level: Appears intact for tasks assessed Behavior During Session: [facility] for tasks performed    Extremity/Trunk Assessment Right Upper Extremity Assessment RUE ROM/Strength/Tone: [facility] for tasks assessed Left Upper Extremity Assessment LUE ROM/Strength/Tone: WFL for tasks assessed Right Lower Extremity Assessment RLE ROM/Strength/Tone: Deficits RLE ROM/Strength/Tone Deficits: pt is able to flex hip/knee to 90 degrees whil in supine with no assistance RLE Sensation: WFL - Light Touch Left Lower Extremity Assessment LLE ROM/Strength/Tone: WFL for tasks assessed Trunk Assessment Trunk Assessment: Normal   Balance    End of Session PT - End of Session Activity Tolerance: Patient tolerated treatment well Patient left: in bed;with call bell/phone within reach;with family/visitor present Nurse Communication: Mobility status  GP     Rada Hay 08/19/2012, 4:06 PM  [phone]

## 2012-08-19 NOTE — Preoperative (Signed)
Beta Blockers   Reason not to administer Beta Blockers:Not Applicable 

## 2012-08-19 NOTE — Anesthesia Procedure Notes (Signed)
Spinal  Patient location during procedure: OR Staffing Anesthesiologist: Voris Tigert Performed by: anesthesiologist  Preanesthetic Checklist Completed: patient identified, site marked, surgical consent, pre-op evaluation, timeout performed, IV checked, risks and benefits discussed and monitors and equipment checked Spinal Block Patient position: sitting Prep: Betadine Patient monitoring: heart rate, continuous pulse ox and blood pressure Approach: right paramedian Location: L2-3 Injection technique: single-shot Needle Needle type: Sprotte  Needle gauge: 24 G Needle length: 9 cm Additional Notes Expiration date of kit checked and confirmed. Patient tolerated procedure well, without complications.     

## 2012-08-20 ENCOUNTER — Encounter (HOSPITAL_COMMUNITY): Payer: Self-pay | Admitting: Orthopedic Surgery

## 2012-08-20 DIAGNOSIS — D500 Iron deficiency anemia secondary to blood loss (chronic): Secondary | ICD-10-CM

## 2012-08-20 DIAGNOSIS — E663 Overweight: Secondary | ICD-10-CM

## 2012-08-20 LAB — CBC
HCT: 30.2 % — ABNORMAL LOW (ref 36.0–46.0)
Hemoglobin: 10.2 g/dL — ABNORMAL LOW (ref 12.0–15.0)
MCH: 32.4 pg (ref 26.0–34.0)
MCHC: 33.8 g/dL (ref 30.0–36.0)
RDW: 12 % (ref 11.5–15.5)

## 2012-08-20 LAB — BASIC METABOLIC PANEL
BUN: 8 mg/dL (ref 6–23)
Calcium: 8.7 mg/dL (ref 8.4–10.5)
GFR calc Af Amer: >90 mL/min (ref >90)
GFR calc non Af Amer: >90 mL/min (ref >90)
Glucose, Bld: 119 mg/dL — ABNORMAL HIGH (ref 70–99)

## 2012-08-20 MED ORDER — METHOCARBAMOL 500 MG PO TABS: 500.0000 mg | ORAL_TABLET | Freq: Four times a day (QID) | ORAL | Status: AC | PRN

## 2012-08-20 MED ORDER — FERROUS SULFATE 325 (65 FE) MG PO TABS: 325.0000 mg | ORAL_TABLET | Freq: Three times a day (TID) | ORAL | Status: AC

## 2012-08-20 MED ORDER — ASPIRIN EC 325 MG PO TBEC: 325.0000 mg | DELAYED_RELEASE_TABLET | Freq: Two times a day (BID) | ORAL | Status: AC

## 2012-08-20 MED ORDER — OXYCODONE-ACETAMINOPHEN 5-325 MG PO TABS: 1.0000 | ORAL_TABLET | ORAL | Status: DC | PRN

## 2012-08-20 MED ORDER — DSS 100 MG PO CAPS: 100.0000 mg | ORAL_CAPSULE | Freq: Two times a day (BID) | ORAL | Status: AC

## 2012-08-20 MED ORDER — OXYCODONE-ACETAMINOPHEN 5-325 MG PO TABS
1.0000 | ORAL_TABLET | ORAL | Status: DC
  Administered 2012-08-20 (×2): 1 via ORAL
  Filled 2012-08-20: qty 1
  Filled 2012-08-20: qty 2

## 2012-08-20 MED ORDER — OXYCODONE HCL 5 MG PO TABS: 5.0000 mg | ORAL_TABLET | ORAL | Status: AC | PRN

## 2012-08-20 MED ORDER — POLYETHYLENE GLYCOL 3350 17 G PO PACK: 17.0000 g | PACK | Freq: Every day | ORAL | Status: AC | PRN

## 2012-08-20 MED ORDER — OXYCODONE HCL 5 MG PO TABS
5.0000 mg | ORAL_TABLET | ORAL | Status: DC
  Administered 2012-08-20: 10 mg via ORAL
  Filled 2012-08-20: qty 2

## 2012-08-20 NOTE — Care Management Note (Signed)
    Page 1 of 2   08/20/2012     6:13:09 PM   CARE MANAGEMENT NOTE 08/20/2012  Patient:  DEJANEE, THIBEAUX   Account Number:  [id number]  Date Initiated:  08/19/2012  Documentation initiated by:  Colleen Can  Subjective/Objective Assessment:   dx right hip osteoarthritis; total hip replacemnt-anterior approach     Action/Plan:   Day of surgery; PT/OT pending. CM will follow   Anticipated DC Date:  08/21/2012   Anticipated DC Plan:  HOME W HOME HEALTH SERVICES  In-house referral  NA      DC Planning Services  CM consult      [facility] Choice  HOME HEALTH  DURABLE MEDICAL EQUIPMENT   Choice offered to / List presented to:  C-1 Patient   DME arranged  3-N-1      DME agency  [facility]     HH arranged  HH-2 PT      [facility] agency  [facility]   Status of service:  Completed, signed off Medicare Important Message given?  NO (If response is "NO", the following Medicare IM given date fields will be blank) Date Medicare IM given:   Date Additional Medicare IM given:    Discharge Disposition:  HOME W HOME HEALTH SERVICES  Per UR Regulation:  Reviewed for med. necessity/level of care/duration of stay  If discussed at Long Length of Stay Meetings, dates discussed:    Comments:  08/20/2012 Colleen Can BSN RN CCM [phone] CM spoke with patient. Plans are for her to return to her home in [place] where spouse and best friend will be caregivers. She has Rw but will need 3N1. Wants [facility] for [facility] services. [facility] notified that patient will need Rw delivered to room. Genevieve Norlander will provide [facility] pt services with start date of tomorrow 08/21/2012.

## 2012-08-20 NOTE — Progress Notes (Signed)
Physical Therapy Treatment Patient Details Name: Rachael Sutton MRN: [id number] DOB: [birth year] Today's Date: 08/20/2012 Time: 1107-1130 PT Time Calculation (min): 23 min  PT Assessment / Plan / Recommendation Comments on Treatment Session  Pt. is ambulating well w/ rw. All education completed and for DC.    Follow Up Recommendations  Home health PT     Does the patient have the potential to tolerate intense rehabilitation     Barriers to Discharge        Equipment Recommendations  None recommended by PT    Recommendations for Other Services    Frequency     Plan Discharge plan remains appropriate    Precautions / Restrictions Precautions Precautions: None   Pertinent Vitals/Pain  2/10 after meds R thigh.    Mobility  Bed Mobility Supine to Sit: 5: Supervision;HOB elevated Details for Bed Mobility Assistance: min cues for hand placement. Transfers Sit to Stand: 5: Supervision Stand to Sit: 5: Supervision;With upper extremity assist;With armrests;To toilet;To chair/3-in-1 Ambulation/Gait Ambulation/Gait Assistance: 5: Supervision Ambulation Distance (Feet): 200 Feet Assistive device: Rolling walker Ambulation/Gait Assistance Details: upon standing, pt had a :spsm" in R thigh. encouraged pt to ambulate w/ RW until PT determines cane. Gait Pattern: Step-through pattern;Decreased step length - right;Antalgic Stairs: Yes Stairs Assistance: 5: Supervision Stairs Assistance Details (indicate cue type and reason): cues for Left leg up, r leg down Stair Management Technique: Forwards;With walker Number of Stairs: 1     Exercises Total Joint Exercises Hip ABduction/ADduction: AROM;Right;Standing;10 reps Marching in Standing: AROM;Right;10 reps;Standing   PT Diagnosis:    PT Problem List:   PT Treatment Interventions:     PT Goals Acute Rehab PT Goals Pt will go Sit to Stand: with supervision PT Goal: Sit to Stand - Progress: Met Pt will go Stand to Sit: with  supervision PT Goal: Stand to Sit - Progress: Met Pt will Ambulate: >150 feet;with supervision;with rolling walker PT Goal: Ambulate - Progress: Met Pt will Go Up / Down Stairs: 1-2 stairs;with supervision PT Goal: Up/Down Stairs - Progress: Met Pt will Perform Home Exercise Program: Independently PT Goal: Perform Home Exercise Program - Progress: Met  Visit Information  Last PT Received On: 08/20/12 Assistance Needed: +1    Subjective Data  Subjective: I had a terrible nite. I am better, readyto go   Cognition  Overall Cognitive Status: Appears within functional limits for tasks assessed/performed Arousal/Alertness: Awake/alert Orientation Level: Appears intact for tasks assessed Behavior During Session: [facility] for tasks performed    Balance     End of Session PT - End of Session Activity Tolerance: Patient tolerated treatment well Patient left:  (standing with pt and spouse.) Nurse Communication: Mobility status   GP     Rada Hay 08/20/2012, 11:46 AM

## 2012-08-20 NOTE — Progress Notes (Signed)
Patient was concerned about her pain management. Patient stated that she requested not to be prescribed hydrocodone , not effective for her pain, prefer percocet. On call was informed and percocet was ordered and hydrocodone D/C'd. Patient request of a list of all medications that have been given to her for her pain management. Made patient aware will attempt to Print out a MAR of her pain medications but if not will hand write them. Patient was okay with that.

## 2012-08-20 NOTE — Discharge Summary (Signed)
Physician Discharge Summary  Patient ID: Rachael Sutton MRN: [id number] DOB/AGE: [birth year] 57 y.o.  Admit date: 08/19/2012 Discharge date: 08/20/2012   Procedures:  Procedure(s) (LRB): TOTAL HIP ARTHROPLASTY ANTERIOR APPROACH (Right)  Attending Physician:  Dr. Durene Romans   Admission Diagnoses:   Right hip OA / pain  Discharge Diagnoses:  Principal Problem:  *S/P right THA, AA Active Problems:  Expected blood loss anemia  Overweight (BMI 25.0-29.9) Diaphragmatic hernia without mention of obstruction or gangrene   Hepatomegaly   Esophageal reflux   Irritable bowel syndrome   Insomnia, unspecified   Abdominal pain, unspecified site   Pure hypercholesterolemia   Other malaise and fatigue   Renal colic   Urinary tract infection, site not specified   Other specified personal history presenting hazards to health   Anxiety state, unspecified   Hematuria   Acquired absence of organ, genital organs   Tobacco use disorder   Depressive disorder, not elsewhere classified   Osteoarthrosis, unspecified whether generalized or localized, unspecified site   Myalgia and myositis, unspecified   Hiatal hernia   Diverticulitis   H. pylori infection - Hx of   Hypothyroidism   Fibromyalgia  HPI:    Rachael Sutton, 57 y.o. female, has a history of pain and functional disability in the right hip(s) due to arthritis and patient has failed non-surgical conservative treatments for greater than 12 weeks to include NSAID's and/or analgesics, corticosteriod injections and activity modification. Onset of symptoms was gradual starting 4 years ago with gradually worsening course since that time.The patient noted prior procedures of the hip to include unknown surgery on the femur on the right hip(s). Patient currently rates pain in the right hip at 8 out of 10 with activity. Patient has night pain, worsening of pain with activity and weight bearing, trendelenberg gait, pain that interfers with  activities of daily living and pain with passive range of motion. Patient has evidence of periarticular osteophytes and joint space narrowing by imaging studies. This condition presents safety issues increasing the risk of falls. There is no current active infection. Risks, benefits and expectations were discussed with the patient. Patient understand the risks, benefits and expectations and wishes to proceed with surgery.   PCP: Aura Dials, MD   Discharged Condition: good  Hospital Course:  Patient underwent the above stated procedure on 08/19/2012. Patient tolerated the procedure well and brought to the recovery room in good condition and subsequently to the floor.  POD #1 BP: 137/78 ; Pulse: 71 ; Temp: 97.6 F (36.4 C) ; Resp: 15  Pt's foley was removed, as well as the hemovac drain removed. IV was changed to a saline lock. Patient reports pain as mild, pain well controlled now. Incident throughout the night that she was having times of her pain not being well controlled. Otherwise no events throughout the night. She feels that she is ready to be discharged home if she does well with PT and pain is well controlled. Neurovascular intact, dorsiflexion/plantar flexion intact, incision: dressing C/D/I, no cellulitis present and compartment soft.   LABS  Basename  08/20/12 0424  HGB  10.2  HCT  30.2    Discharge Exam: General appearance: alert, cooperative and no distress Extremities: Homans sign is negative, no sign of DVT, no edema, redness or tenderness in the calves or thighs and no ulcers, gangrene or trophic changes  Disposition:   Home or Self Care with follow up in 2 weeks  Follow up in 2 weeks at [facility]. Follow up with OLIN,Lopaka Karge D in 2 weeks.  Contact information:  [facility] [address] [place] [address] [phone]        Discharge Orders    Future Orders Please Complete By Expires   Diet - low  sodium heart healthy      Call MD / Call 911      Comments:   If you experience chest pain or shortness of breath, CALL 911 and be transported to the hospital emergency room.  If you develope a fever above 101 F, pus (white drainage) or increased drainage or redness at the wound, or calf pain, call your surgeon's office.   Discharge instructions      Comments:   Maintain surgical dressing for 10-14 days, then replace with gauze and tape. Keep the area dry and clean until follow up. Follow up in 2 weeks at [facility]. Call with any questions or concerns.   Constipation Prevention      Comments:   Drink plenty of fluids.  Prune juice may be helpful.  You may use a stool softener, such as Colace (over the counter) 100 mg twice a day.  Use MiraLax (over the counter) for constipation as needed.   Increase activity slowly as tolerated      Weight bearing as tolerated      Change dressing      Comments:   Maintain surgical dressing for 10-14 days, then replace with 4x4 guaze and tape. Keep the area dry and clean.   TED hose      Comments:   Use stockings (TED hose) for 2 weeks on both leg(s).  You may remove them at night for sleeping.      Discharge Medication List as of 08/20/2012 11:19 AM    START taking these medications   Details  aspirin EC 325 MG tablet Take 1 tablet (325 mg total) by mouth 2 (two) times daily. X 4 weeks, Starting 08/20/2012, Until Discontinued, No Print    docusate sodium 100 MG CAPS Take 100 mg by mouth 2 (two) times daily., Starting 08/20/2012, Until Discontinued, No Print    ferrous sulfate 325 (65 FE) MG tablet Take 1 tablet (325 mg total) by mouth 3 (three) times daily after meals., Starting 08/20/2012, Until Discontinued, No Print    oxyCODONE (OXY IR/ROXICODONE) 5 MG immediate release tablet Take 1-3 tablets (5-15 mg total) by mouth every 4 (four) hours as needed for pain., Starting 08/20/2012, Until Discontinued, Print    polyethylene glycol (MIRALAX  / GLYCOLAX) packet Take 17 g by mouth daily as needed., Starting 08/20/2012, Until Discontinued, No Print      CONTINUE these medications which have CHANGED   Details  methocarbamol (ROBAXIN) 500 MG tablet Take 1 tablet (500 mg total) by mouth every 6 (six) hours as needed (muscle spasms)., Starting 08/20/2012, Until Discontinued, No Print      CONTINUE these medications which have NOT CHANGED   Details  celecoxib (CELEBREX) 100 MG capsule Take 100 mg by mouth daily., Until Discontinued, Historical Med    DEXILANT 60 MG capsule TAKE ONE CAPSULE BY MOUTH 30 MINUTES BEFORE BREAKFAST DAILY, Normal    levothyroxine (SYNTHROID, LEVOTHROID) 50 MCG tablet Take 50 mcg by mouth daily before breakfast. , Starting 06/16/2012, Until Discontinued, Historical Med    loratadine (CLARITIN) 10 MG tablet Take 10 mg by mouth daily., Until Discontinued, Historical Med    LORazepam (ATIVAN) 0.5 MG tablet Take 0.5 mg by mouth  at bedtime as needed. Anxiety, Until Discontinued, Historical Med    !! OVER THE COUNTER MEDICATION Take 2 tablets by mouth daily. calcifood-over the counter calcium, Until Discontinued, Historical Med    !! OVER THE COUNTER MEDICATION Take 1 tablet by mouth 3 (three) times daily. biost-chewable digestion enhancer, Until Discontinued, Historical Med    rosuvastatin (CRESTOR) 5 MG tablet Take 5 mg by mouth every evening., Until Discontinued, Historical Med    Estradiol Acetate (FEMRING) 0.05 MG/24HR RING Place vaginally. Every three months, Until Discontinued, Historical Med    fluticasone (FLONASE) 50 MCG/ACT nasal spray Place 1 spray into the nose daily., Until Discontinued, Historical Med    RABEprazole (ACIPHEX) 20 MG tablet Take 1 tablet (20 mg total) by mouth daily., Starting 06/15/2011, Until Sat 06/14/12, Normal     !! - Potential duplicate medications found. Please discuss with provider.    STOP taking these medications     diclofenac (VOLTAREN) 75 MG EC tablet Comments:    Reason for Stopping:       traMADol (ULTRAM) 50 MG tablet Comments:  Reason for Stopping:           Signed: Anastasio Auerbach. Goble Fudala   PAC  08/20/2012, 6:19 PM

## 2012-08-20 NOTE — Progress Notes (Signed)
Occupational Therapy Treatment Patient Details Name: Rachael Sutton MRN: [id number] DOB: 1955/10/29 Today's Date: 08/20/2012 Time: 7829-5621 OT Time Calculation (min): 34 min  OT Assessment / Plan / Recommendation Comments on Treatment Session Pt reported some mild dizziness at end of session. BP 116/68. Reclined pt in chair. Pt mobilizing very well and is at a supervision level for all ADLs.    Follow Up Recommendations  No OT follow up    Barriers to Discharge       Equipment Recommendations  3 in 1 bedside comode    Recommendations for Other Services    Frequency     Plan All goals met and education completed, patient discharged from OT services    Precautions / Restrictions Precautions Precautions: None   Pertinent Vitals/Pain Pt reported 6/10 pain in R hip. Repositioned for comfort.    ADL  Upper Body Dressing: Set up Where Assessed - Upper Body Dressing: Unsupported sitting Lower Body Dressing: Set up Where Assessed - Lower Body Dressing: Supported sit to stand Toilet Transfer: Supervision/safety Statistician Method: Sit to Barista: Comfort height toilet;Grab bars Toileting - Architect and Hygiene: Supervision/safety Where Assessed - Engineer, mining and Hygiene: Sit to stand from 3-in-1 or toilet Tub/Shower Transfer: Supervision/safety Tub/Shower Transfer Method: Ambulating Equipment Used: Rolling walker Transfers/Ambulation Related to ADLs: Pt ambulated to the bathroom with supervision. Educated pt how to safely step into the shower. Pt would benefit from 3n1 over toilet. ADL Comments: Educated pt how to don/doff underwear and pants. Pt able to complete without any physical A.    OT Diagnosis:    OT Problem List:   OT Treatment Interventions:     OT Goals ADL Goals ADL Goal: Grooming - Progress: Met ADL Goal: Lower Body Bathing - Progress: Met ADL Goal: Lower Body Dressing - Progress: Met ADL Goal:  Toilet Transfer - Progress: Met ADL Goal: Toileting - Clothing Manipulation - Progress: Met ADL Goal: Toileting - Hygiene - Progress: Met ADL Goal: Tub/Shower Transfer - Progress: Met  Visit Information  Last OT Received On: 08/20/12 Assistance Needed: +1    Subjective Data  Subjective: I hope I get to go home today.   Prior Functioning       Cognition  Overall Cognitive Status: Appears within functional limits for tasks assessed/performed Arousal/Alertness: Awake/alert Orientation Level: Appears intact for tasks assessed Behavior During Session: [facility] for tasks performed    Mobility  Shoulder Instructions Bed Mobility Supine to Sit: 5: Supervision;HOB elevated Details for Bed Mobility Assistance: min cues for hand placement. Transfers Sit to Stand: 5: Supervision;With upper extremity assist;From bed;From toilet Stand to Sit: 5: Supervision;With upper extremity assist;With armrests;To toilet;To chair/3-in-1       Exercises      Balance     End of Session OT - End of Session Activity Tolerance: Patient tolerated treatment well Patient left: in chair;with call bell/phone within reach  GO     Zakyria Metzinger A OTR/L [phone] 08/20/2012, 9:33 AM

## 2012-08-20 NOTE — Progress Notes (Signed)
   Subjective: 1 Day Post-Op Procedure(s) (LRB): TOTAL HIP ARTHROPLASTY ANTERIOR APPROACH (Right)   Patient reports pain as mild, pain well controlled now. Incident throughout the night that she was having times of her pain not being well controlled. Otherwise no events throughout the night. She feels that she is ready to be discharged home if she does well with PT and pain is well controlled.   Objective:   VITALS:   Filed Vitals:   08/20/12   BP: 137/78  Pulse: 71  Temp: 97.6 F (36.4 C)   Resp: 15    Neurovascular intact Dorsiflexion/Plantar flexion intact Incision: dressing C/D/I No cellulitis present Compartment soft  LABS  Basename 08/20/12 0424  HGB 10.2*  HCT 30.2*  WBC 10.4  PLT 243     Basename 08/20/12 0424  NA 141  K 3.9  BUN 8  CREATININE 0.52  GLUCOSE 119*     Assessment/Plan: 1 Day Post-Op Procedure(s) (LRB): TOTAL HIP ARTHROPLASTY ANTERIOR APPROACH (Right) HV drain d/c'ed Foley cath d/c'ed Advance diet Up with therapy D/C IV fluids Discharge home with home health Follow up in 2 weeks at [facility]. Follow up with OLIN,Shain Pauwels D in 2 weeks.  Contact information:  [facility] [address] [place] [address] [phone]    Expected ABLA  Treated with iron and will observe  Overweight (BMI 25-29.9) Estimated Body mass index is 25.10 kg/(m^2) as calculated from the following:   Height as of this encounter: 5\' 9" (1.753 m).   Weight as of this encounter: 170 lb(77.111 kg). Patient also counseled that weight may inhibit the healing process Patient counseled that losing weight will help with future health issues       Anastasio Auerbach. Glennice Marcos   PAC  08/20/2012, 10:02 AM

## 2012-10-09 ENCOUNTER — Ambulatory Visit (INDEPENDENT_AMBULATORY_CARE_PROVIDER_SITE_OTHER): Payer: BC Managed Care – PPO | Admitting: Licensed Clinical Social Worker

## 2012-10-09 DIAGNOSIS — IMO0002 Reserved for concepts with insufficient information to code with codable children: Secondary | ICD-10-CM

## 2012-10-16 ENCOUNTER — Ambulatory Visit: Payer: BC Managed Care – PPO | Admitting: Licensed Clinical Social Worker

## 2012-10-30 ENCOUNTER — Ambulatory Visit: Payer: BC Managed Care – PPO | Admitting: Licensed Clinical Social Worker

## 2013-06-30 ENCOUNTER — Other Ambulatory Visit: Payer: Self-pay

## 2013-06-30 DIAGNOSIS — Z1231 Encounter for screening mammogram for malignant neoplasm of breast: Secondary | ICD-10-CM

## 2013-08-03 ENCOUNTER — Other Ambulatory Visit: Payer: Self-pay | Admitting: Neurological Surgery

## 2013-08-03 DIAGNOSIS — M542 Cervicalgia: Secondary | ICD-10-CM

## 2013-08-11 ENCOUNTER — Ambulatory Visit: Payer: BC Managed Care – PPO

## 2013-08-12 ENCOUNTER — Ambulatory Visit
Admission: RE | Admit: 2013-08-12 | Discharge: 2013-08-12 | Disposition: A | Discharge status: Home / Self Care | Payer: PRIVATE HEALTH INSURANCE | Source: Ambulatory Visit

## 2013-08-12 ENCOUNTER — Other Ambulatory Visit: Payer: Self-pay

## 2013-08-12 DIAGNOSIS — Z1231 Encounter for screening mammogram for malignant neoplasm of breast: Secondary | ICD-10-CM

## 2013-08-13 ENCOUNTER — Ambulatory Visit
Admission: RE | Admit: 2013-08-13 | Discharge: 2013-08-13 | Disposition: A | Discharge status: Home / Self Care | Payer: PRIVATE HEALTH INSURANCE | Source: Ambulatory Visit | Attending: Neurological Surgery | Admitting: Neurological Surgery

## 2013-08-13 DIAGNOSIS — M542 Cervicalgia: Secondary | ICD-10-CM

## 2013-11-03 ENCOUNTER — Other Ambulatory Visit: Payer: Self-pay | Admitting: Gastroenterology

## 2014-03-25 ENCOUNTER — Encounter: Payer: Self-pay | Admitting: Gastroenterology

## 2014-05-05 ENCOUNTER — Other Ambulatory Visit: Payer: Self-pay | Admitting: Physician Assistant

## 2014-05-05 DIAGNOSIS — M25552 Pain in left hip: Secondary | ICD-10-CM

## 2014-05-11 ENCOUNTER — Ambulatory Visit
Admission: RE | Admit: 2014-05-11 | Discharge: 2014-05-11 | Disposition: A | Discharge status: Home / Self Care | Payer: PRIVATE HEALTH INSURANCE | Source: Ambulatory Visit | Attending: Physician Assistant | Admitting: Physician Assistant

## 2014-05-11 DIAGNOSIS — M25552 Pain in left hip: Secondary | ICD-10-CM

## 2014-05-11 MED ORDER — METHYLPREDNISOLONE ACETATE 40 MG/ML INJ SUSP (RADIOLOG
120.0000 mg | Freq: Once | INTRAMUSCULAR | Status: AC
  Administered 2014-05-11: 120 mg via INTRA_ARTICULAR

## 2014-05-11 MED ORDER — IOHEXOL 180 MG/ML  SOLN
1.0000 mL | Freq: Once | INTRAMUSCULAR | Status: AC | PRN
Start: 2014-05-11 — End: 2014-05-11
  Administered 2014-05-11: 1 mL via INTRA_ARTICULAR

## 2014-07-12 ENCOUNTER — Other Ambulatory Visit: Payer: Self-pay

## 2014-07-12 DIAGNOSIS — Z1231 Encounter for screening mammogram for malignant neoplasm of breast: Secondary | ICD-10-CM

## 2014-08-16 ENCOUNTER — Ambulatory Visit
Admission: RE | Admit: 2014-08-16 | Discharge: 2014-08-16 | Disposition: A | Discharge status: Home / Self Care | Payer: PRIVATE HEALTH INSURANCE | Source: Ambulatory Visit

## 2014-08-16 ENCOUNTER — Other Ambulatory Visit: Payer: Self-pay

## 2014-08-16 DIAGNOSIS — Z1231 Encounter for screening mammogram for malignant neoplasm of breast: Secondary | ICD-10-CM

## 2015-04-01 ENCOUNTER — Other Ambulatory Visit: Payer: Self-pay | Admitting: Orthopedic Surgery

## 2015-04-01 DIAGNOSIS — M1612 Unilateral primary osteoarthritis, left hip: Secondary | ICD-10-CM

## 2015-04-06 ENCOUNTER — Ambulatory Visit
Admission: RE | Admit: 2015-04-06 | Discharge: 2015-04-06 | Disposition: A | Discharge status: Home / Self Care | Payer: PRIVATE HEALTH INSURANCE | Source: Ambulatory Visit | Attending: Orthopedic Surgery | Admitting: Orthopedic Surgery

## 2015-04-06 DIAGNOSIS — M1612 Unilateral primary osteoarthritis, left hip: Secondary | ICD-10-CM

## 2015-04-06 MED ORDER — IOHEXOL 180 MG/ML  SOLN: 1.0000 mL | Freq: Once | INTRAMUSCULAR | Status: DC | PRN

## 2015-04-06 MED ORDER — METHYLPREDNISOLONE ACETATE 40 MG/ML INJ SUSP (RADIOLOG: 120.0000 mg | Freq: Once | INTRAMUSCULAR | Status: DC

## 2015-10-10 ENCOUNTER — Other Ambulatory Visit: Payer: Self-pay

## 2015-10-10 DIAGNOSIS — Z1231 Encounter for screening mammogram for malignant neoplasm of breast: Secondary | ICD-10-CM

## 2015-11-01 ENCOUNTER — Ambulatory Visit
Admission: RE | Admit: 2015-11-01 | Discharge: 2015-11-01 | Disposition: A | Discharge status: Home / Self Care | Payer: PRIVATE HEALTH INSURANCE | Source: Ambulatory Visit

## 2015-11-01 DIAGNOSIS — Z1231 Encounter for screening mammogram for malignant neoplasm of breast: Secondary | ICD-10-CM

## 2016-09-24 ENCOUNTER — Other Ambulatory Visit: Payer: Self-pay | Admitting: Family Medicine

## 2016-09-24 ENCOUNTER — Other Ambulatory Visit: Payer: Self-pay | Admitting: Family

## 2016-09-24 DIAGNOSIS — Z1231 Encounter for screening mammogram for malignant neoplasm of breast: Secondary | ICD-10-CM

## 2016-11-08 ENCOUNTER — Other Ambulatory Visit: Payer: Self-pay | Admitting: Orthopedic Surgery

## 2016-11-08 DIAGNOSIS — M7711 Lateral epicondylitis, right elbow: Secondary | ICD-10-CM

## 2016-11-13 ENCOUNTER — Ambulatory Visit: Payer: PRIVATE HEALTH INSURANCE

## 2016-11-14 ENCOUNTER — Ambulatory Visit
Admission: RE | Admit: 2016-11-14 | Discharge: 2016-11-14 | Disposition: A | Discharge status: Home / Self Care | Payer: BLUE CROSS/BLUE SHIELD | Source: Ambulatory Visit | Attending: Family | Admitting: Family

## 2016-11-14 DIAGNOSIS — Z1231 Encounter for screening mammogram for malignant neoplasm of breast: Secondary | ICD-10-CM

## 2016-11-20 ENCOUNTER — Ambulatory Visit
Admission: RE | Admit: 2016-11-20 | Discharge: 2016-11-20 | Disposition: A | Discharge status: Home / Self Care | Payer: BLUE CROSS/BLUE SHIELD | Source: Ambulatory Visit | Attending: Orthopedic Surgery | Admitting: Orthopedic Surgery

## 2016-11-20 DIAGNOSIS — M7711 Lateral epicondylitis, right elbow: Secondary | ICD-10-CM

## 2017-10-14 ENCOUNTER — Other Ambulatory Visit: Payer: Self-pay | Admitting: Family Medicine

## 2017-10-14 DIAGNOSIS — Z139 Encounter for screening, unspecified: Secondary | ICD-10-CM

## 2017-11-18 ENCOUNTER — Ambulatory Visit
Admission: RE | Admit: 2017-11-18 | Discharge: 2017-11-18 | Disposition: A | Discharge status: Home / Self Care | Payer: BLUE CROSS/BLUE SHIELD | Source: Ambulatory Visit | Attending: Family Medicine | Admitting: Family Medicine

## 2017-11-18 DIAGNOSIS — Z139 Encounter for screening, unspecified: Secondary | ICD-10-CM

## 2018-10-10 ENCOUNTER — Other Ambulatory Visit: Payer: Self-pay | Admitting: Family Medicine

## 2018-10-10 DIAGNOSIS — Z1231 Encounter for screening mammogram for malignant neoplasm of breast: Secondary | ICD-10-CM

## 2018-11-24 ENCOUNTER — Ambulatory Visit: Payer: BLUE CROSS/BLUE SHIELD

## 2018-12-24 ENCOUNTER — Other Ambulatory Visit: Payer: Self-pay

## 2018-12-24 ENCOUNTER — Ambulatory Visit
Admission: RE | Admit: 2018-12-24 | Discharge: 2018-12-24 | Disposition: A | Discharge status: Home / Self Care | Payer: BLUE CROSS/BLUE SHIELD | Source: Ambulatory Visit | Attending: Family Medicine | Admitting: Family Medicine

## 2018-12-24 DIAGNOSIS — Z1231 Encounter for screening mammogram for malignant neoplasm of breast: Secondary | ICD-10-CM

## 2019-03-30 IMAGING — MR MR ELBOW*R* W/O CM
5 series · 40 of 40 positions shown · non-contrast
Comparison: None.

CLINICAL DATA: Right elbow pain and swelling since tennis injury

EXAM:
MRI OF THE RIGHT ELBOW WITHOUT CONTRAST
TECHNIQUE: Multiplanar, multisequence MR imaging of the elbow was performed. No
intravenous contrast was administered.

[Series 100: T1 · axial · right · 3.0mm · 0.44mm/px · z∈[-39,+39]mm · 10 of 26 slices shown]
[im 1/26]
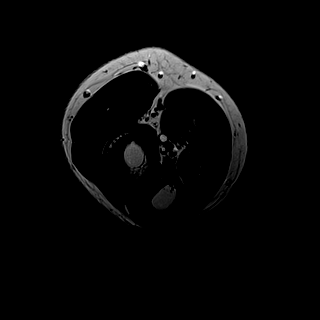
[im 3/26]
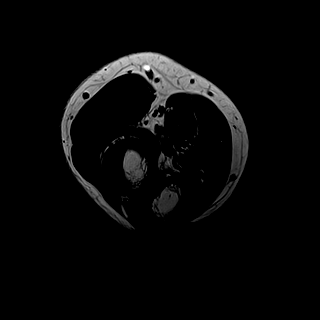
[im 6/26]
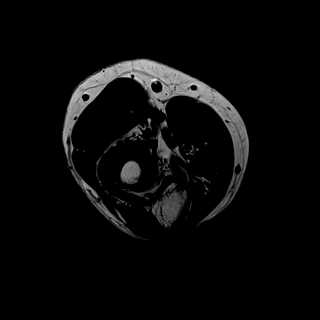
[im 9/26]
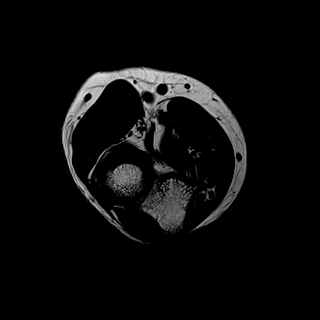
[im 12/26]
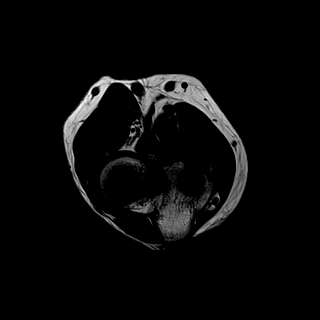
[im 14/26]
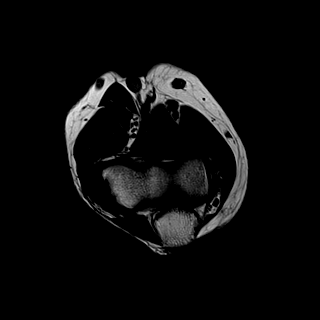
[im 17/26]
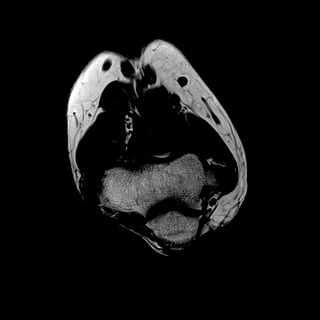
[im 20/26]
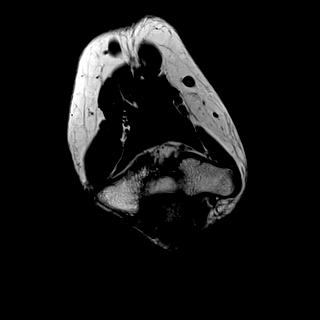
[im 23/26]
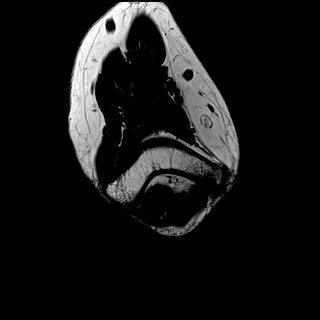
[im 26/26]
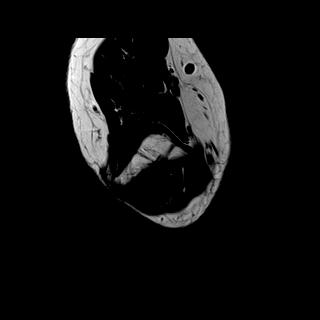

[Series 101: T2 fat-sat · axial · right · 3.0mm · 0.44mm/px · z∈[-39,+39]mm · 9 of 26 slices shown (1 of 2)]
[im 1/26]
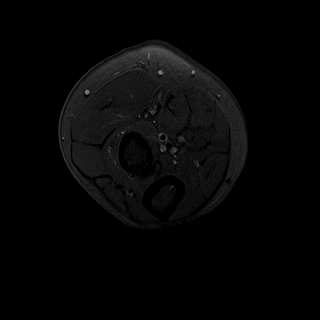
[im 4/26]
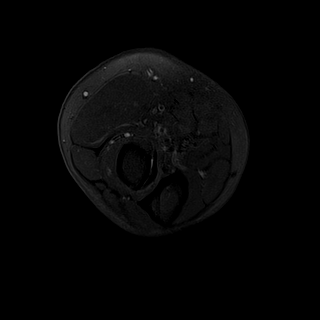
[im 7/26]
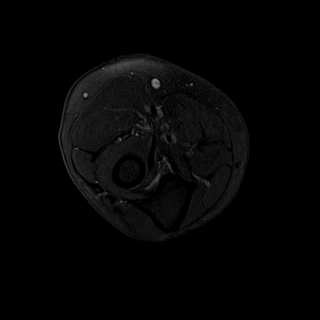
[im 10/26]
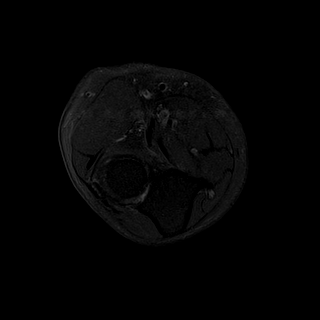
[im 13/26]
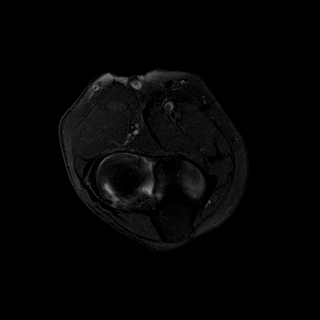
[im 16/26]
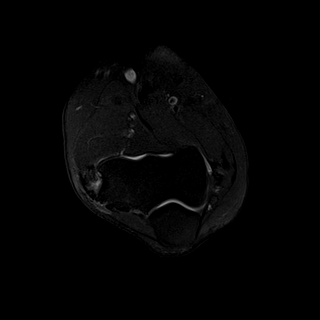
[im 19/26]
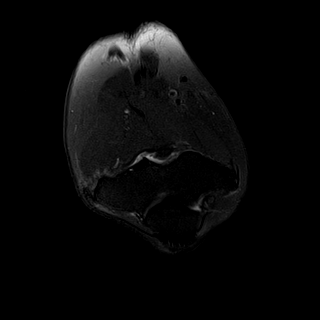
[im 22/26]
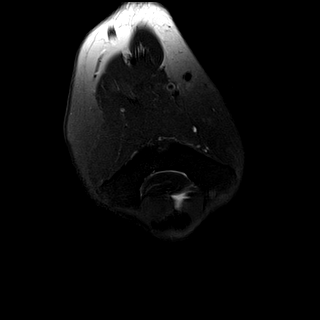
[im 26/26]
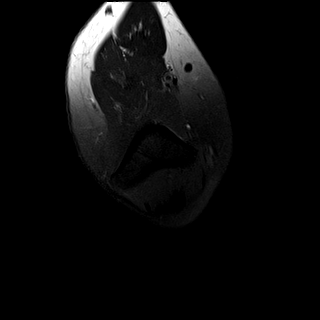

[Series 102: T2 fat-sat · sagittal · right · 3.0mm · 0.44mm/px · 7 of 20 slices shown (2 of 2)]
[im 1/20]
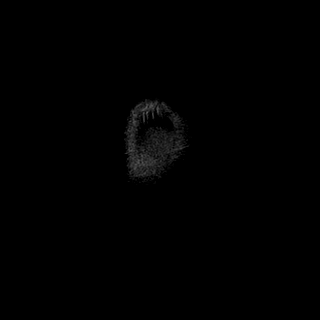
[im 4/20]
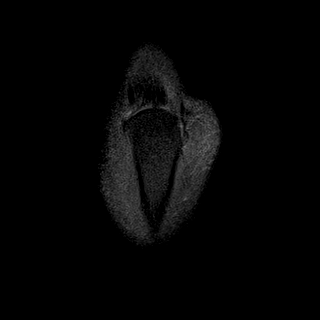
[im 7/20]
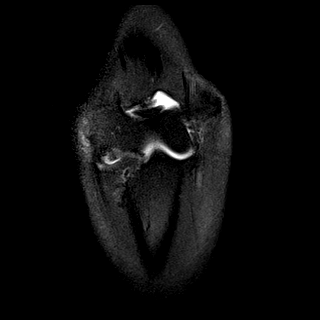
[im 10/20]
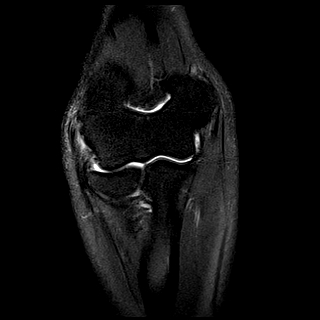
[im 13/20]
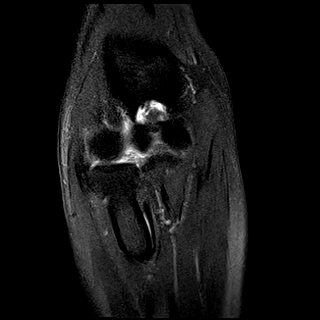
[im 16/20]
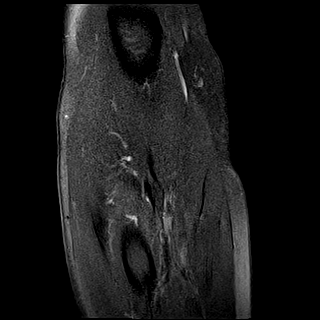
[im 20/20]
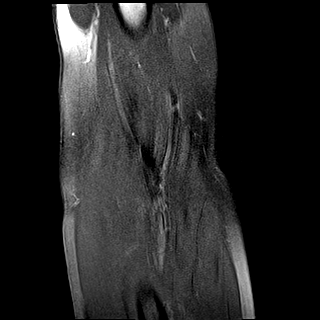

[Series 103: PD fat-sat · coronal · right · 3.0mm · 0.36mm/px · 7 of 20 slices shown]
[im 1/20]
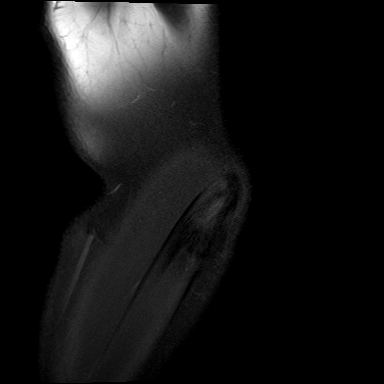
[im 4/20]
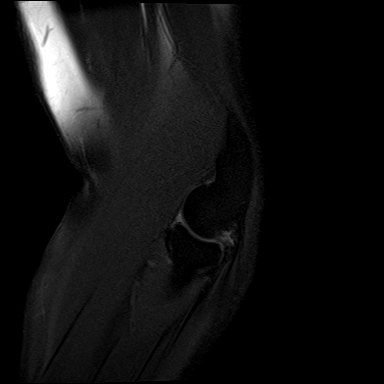
[im 7/20]
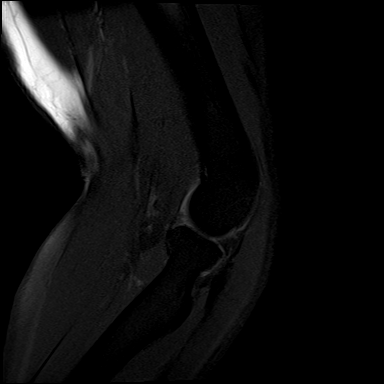
[im 10/20]
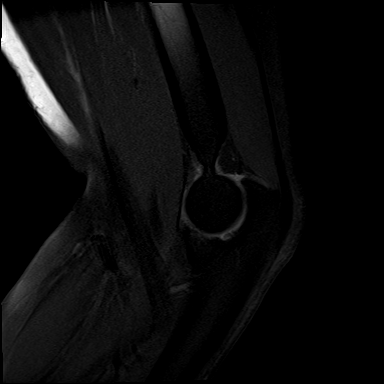
[im 13/20]
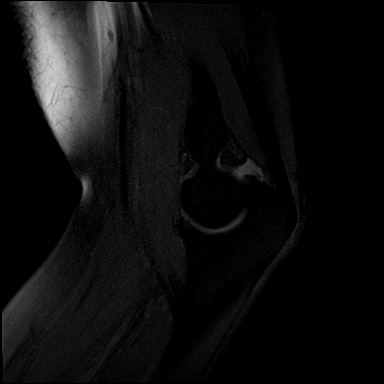
[im 16/20]
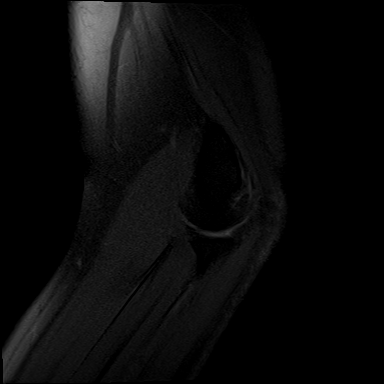
[im 20/20]
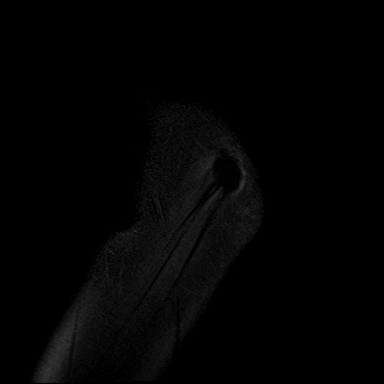

[Series 104: STIR · sagittal · right · 3.0mm · 0.55mm/px · 7 of 20 slices shown]
[im 1/20]
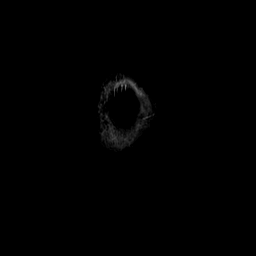
[im 4/20]
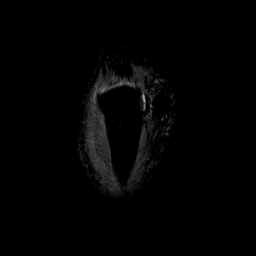
[im 7/20]
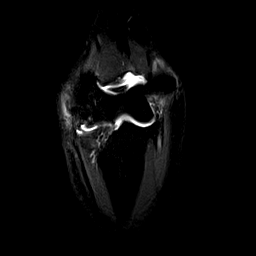
[im 10/20]
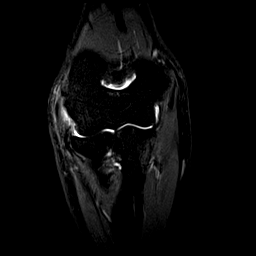
[im 13/20]
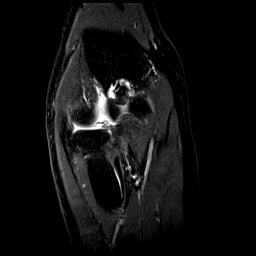
[im 16/20]
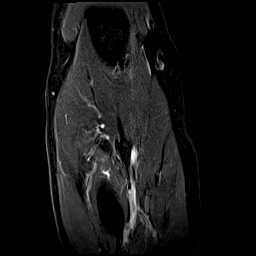
[im 20/20]
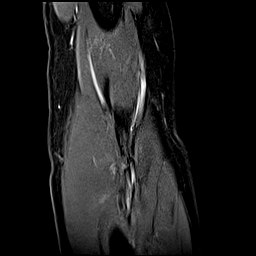

[40 of 40 positions shown; findings below may reference images not displayed]

FINDINGS: TENDONS

Common forearm flexor origin: Intact.

Common forearm extensor origin: Severe tendinosis of the common
extensor tendon origin with a high-grade partial-thickness tear.

Biceps: Intact.

Triceps: Intact.

LIGAMENTS

Medial stabilizers: Intact.

Lateral stabilizers: Increased signal at the humeral attachment of
the radial collateral ligament concerning for injury. Lateral ulnar
collateral ligament is intact. Annual ligament is grossly intact.

Cartilage: No chondral defect.

Joint: No joint effusion.  No intra-articular loose body.

Cubital tunnel: Normal cubital tunnel.

Bones: No marrow signal abnormality.  No fracture or dislocation.

Soft tissue: No muscle abnormality. No muscle atrophy or edema. No
fluid collection or hematoma.
IMPRESSION: 1. Severe tendinosis of the common extensor tendon origin with a
high-grade partial-thickness tear.
2. Increased signal at the humeral attachment of the radial
collateral ligament concerning for injury. Lateral ulnar collateral
ligament is intact.

## 2020-02-22 ENCOUNTER — Other Ambulatory Visit: Payer: Self-pay | Admitting: Nurse Practitioner

## 2020-02-22 DIAGNOSIS — Z1231 Encounter for screening mammogram for malignant neoplasm of breast: Secondary | ICD-10-CM

## 2020-03-08 ENCOUNTER — Other Ambulatory Visit: Payer: Self-pay

## 2020-03-08 ENCOUNTER — Ambulatory Visit
Admission: RE | Admit: 2020-03-08 | Discharge: 2020-03-08 | Disposition: A | Discharge status: Home / Self Care | Payer: BLUE CROSS/BLUE SHIELD | Source: Ambulatory Visit | Attending: Nurse Practitioner | Admitting: Nurse Practitioner

## 2020-03-08 DIAGNOSIS — Z1231 Encounter for screening mammogram for malignant neoplasm of breast: Secondary | ICD-10-CM

## 2020-10-12 ENCOUNTER — Encounter: Payer: Self-pay | Admitting: Gastroenterology

## 2021-01-30 ENCOUNTER — Other Ambulatory Visit: Payer: Self-pay | Admitting: Nurse Practitioner

## 2021-01-30 DIAGNOSIS — Z1231 Encounter for screening mammogram for malignant neoplasm of breast: Secondary | ICD-10-CM
